# Patient Record
Sex: Male | Born: 1940 | Race: Black or African American | Hispanic: No | Marital: Married
Health system: Southern US, Community
[De-identification: ages and names within clinical notes are randomized; demographics above are authoritative.]

## PROBLEM LIST (undated history)

## (undated) DIAGNOSIS — E785 Hyperlipidemia, unspecified: Secondary | ICD-10-CM

## (undated) DIAGNOSIS — I480 Paroxysmal atrial fibrillation: Secondary | ICD-10-CM

## (undated) DIAGNOSIS — G1221 Amyotrophic lateral sclerosis: Secondary | ICD-10-CM

## (undated) DIAGNOSIS — D638 Anemia in other chronic diseases classified elsewhere: Secondary | ICD-10-CM

## (undated) DIAGNOSIS — I4892 Unspecified atrial flutter: Secondary | ICD-10-CM

## (undated) DIAGNOSIS — N183 Chronic kidney disease, stage 3 unspecified: Secondary | ICD-10-CM

## (undated) DIAGNOSIS — E875 Hyperkalemia: Principal | ICD-10-CM

## (undated) DIAGNOSIS — I2510 Atherosclerotic heart disease of native coronary artery without angina pectoris: Secondary | ICD-10-CM

## (undated) DIAGNOSIS — I10 Essential (primary) hypertension: Secondary | ICD-10-CM

## (undated) HISTORY — DX: Atherosclerotic heart disease of native coronary artery without angina pectoris: I25.10

---

## 2013-10-12 ENCOUNTER — Emergency Department: Payer: Self-pay | Admitting: Emergency Medicine

## 2013-10-13 LAB — BASIC METABOLIC PANEL
ANION GAP: 7 (ref 7–16)
BUN: 26 mg/dL — ABNORMAL HIGH (ref 7–18)
CALCIUM: 9.2 mg/dL (ref 8.5–10.1)
Chloride: 106 mmol/L (ref 98–107)
Co2: 25 mmol/L (ref 21–32)
Creatinine: 1.49 mg/dL — ABNORMAL HIGH (ref 0.60–1.30)
EGFR (African American): 54 — ABNORMAL LOW
EGFR (Non-African Amer.): 47 — ABNORMAL LOW
GLUCOSE: 107 mg/dL — AB (ref 65–99)
Osmolality: 281 (ref 275–301)
POTASSIUM: 4.3 mmol/L (ref 3.5–5.1)
SODIUM: 138 mmol/L (ref 136–145)

## 2013-10-13 LAB — CBC
HCT: 37.8 % — ABNORMAL LOW (ref 40.0–52.0)
HGB: 12.6 g/dL — ABNORMAL LOW (ref 13.0–18.0)
MCH: 31.5 pg (ref 26.0–34.0)
MCHC: 33.3 g/dL (ref 32.0–36.0)
MCV: 95 fL (ref 80–100)
PLATELETS: 243 10*3/uL (ref 150–440)
RBC: 4.01 10*6/uL — AB (ref 4.40–5.90)
RDW: 11.9 % (ref 11.5–14.5)
WBC: 6.5 10*3/uL (ref 3.8–10.6)

## 2013-10-13 LAB — TROPONIN I
Troponin-I: 0.02 ng/mL
Troponin-I: 0.03 ng/mL

## 2020-11-05 ENCOUNTER — Emergency Department
Admission: EM | Admit: 2020-11-05 | Discharge: 2020-11-05 | Disposition: A | Discharge status: Home / Self Care | Payer: Medicare Other | Attending: Emergency Medicine | Admitting: Emergency Medicine

## 2020-11-05 ENCOUNTER — Other Ambulatory Visit: Payer: Self-pay

## 2020-11-05 ENCOUNTER — Emergency Department: Payer: Medicare Other

## 2020-11-05 DIAGNOSIS — S0990XA Unspecified injury of head, initial encounter: Secondary | ICD-10-CM

## 2020-11-05 DIAGNOSIS — I129 Hypertensive chronic kidney disease with stage 1 through stage 4 chronic kidney disease, or unspecified chronic kidney disease: Secondary | ICD-10-CM | POA: Diagnosis not present

## 2020-11-05 DIAGNOSIS — R55 Syncope and collapse: Secondary | ICD-10-CM | POA: Insufficient documentation

## 2020-11-05 DIAGNOSIS — N189 Chronic kidney disease, unspecified: Secondary | ICD-10-CM | POA: Insufficient documentation

## 2020-11-05 DIAGNOSIS — W19XXXA Unspecified fall, initial encounter: Secondary | ICD-10-CM

## 2020-11-05 DIAGNOSIS — R42 Dizziness and giddiness: Secondary | ICD-10-CM | POA: Diagnosis not present

## 2020-11-05 LAB — BASIC METABOLIC PANEL
Anion gap: 11 (ref 5–15)
BUN: 36 mg/dL — ABNORMAL HIGH (ref 8–23)
CO2: 24 mmol/L (ref 22–32)
Calcium: 9.4 mg/dL (ref 8.9–10.3)
Chloride: 99 mmol/L (ref 98–111)
Creatinine, Ser: 1.67 mg/dL — ABNORMAL HIGH (ref 0.61–1.24)
GFR, Estimated: 42 mL/min — ABNORMAL LOW (ref >60)
Glucose, Bld: 94 mg/dL (ref 70–99)
Potassium: 4.8 mmol/L (ref 3.5–5.1)
Sodium: 134 mmol/L — ABNORMAL LOW (ref 135–145)

## 2020-11-05 LAB — CBC
HCT: 36.9 % — ABNORMAL LOW (ref 39.0–52.0)
Hemoglobin: 12 g/dL — ABNORMAL LOW (ref 13.0–17.0)
MCH: 30.2 pg (ref 26.0–34.0)
MCHC: 32.5 g/dL (ref 30.0–36.0)
MCV: 92.9 fL (ref 80.0–100.0)
Platelets: 376 10*3/uL (ref 150–400)
RBC: 3.97 MIL/uL — ABNORMAL LOW (ref 4.22–5.81)
RDW: 11.5 % (ref 11.5–15.5)
WBC: 7.1 10*3/uL (ref 4.0–10.5)
nRBC: 0 % (ref 0.0–0.2)

## 2020-11-05 LAB — TROPONIN I (HIGH SENSITIVITY): Troponin I (High Sensitivity): 14 ng/L (ref <18)

## 2020-11-05 NOTE — ED Notes (Signed)
Pt alert and oriented X 4, stable for discharge. RR even and unlabored, color WNL. Discussed discharge instructions and follow-up as directed. Discharge medications discussed if prescribed. Pt had opportunity to ask questions, and RN to provide patient/family eduction.  

## 2020-11-05 NOTE — Discharge Instructions (Addendum)
You have been seen in the emergency department after passing out.  As we discussed please drink plenty of fluids over the next several days.  Please sit when urinating overnight or if you are feeling lightheaded or dizzy.  Please follow-up with your doctor for further evaluation and to discuss further work-up such as a Holter monitor.  Return to the emergency department for any further lightheadedness episodes, passing out, any chest pain trouble breathing or any other symptom personally concerning to yourself.

## 2020-11-05 NOTE — ED Triage Notes (Signed)
C/O feeling dizzy after going to the bathroom this morning.  States fell and hit forehead.  ? LOC. States after initial fall, felt dizzy again and wife found patient on floor.  AAOx3.  Skin warm and dry. NAD

## 2020-11-05 NOTE — ED Provider Notes (Signed)
[facility] Emergency Department Provider Note  Time seen: 11:54 AM  I have reviewed the triage vital signs and the nursing notes.   HISTORY  Chief Complaint Fall   HPI Raymond Meyers. is a 80 y.o. male with a past medical history of hypertension, hyperlipidemia, CKD, palpitations on metoprolol who presents to the emergency department after syncopal episode.  According to the patient around 4:00 this morning patient just finished urinating when he went to flush the toilet became very lightheaded and dizzy.  States he went to brace himself against the sink and ended up losing consciousness and hitting his head.  Patient states he tried to stand back up and got dizzy and fell to the ground again.  Patient states he had a primary care appointment today at 2 PM so he decided to rest in bed, but the patient was concerned due to a cut on his forehead so he came to the emergency department after some time for evaluation.   Here the patient is awake alert oriented, no distress.  He states 2 to 3 weeks ago similar event happened overnight while he was urinating as well.  States no other events have occurred.  Denies any chest pain shortness of breath at any point.  Denies any nausea vomiting or diarrhea.  No recent illnesses or fever.  No past medical history on file.  There are no problems to display for this patient.   Prior to Admission medications   Not on File    No Known Allergies  No family history on file.  Social History    Review of Systems Constitutional: Positive for LOC. Cardiovascular: Negative for chest pain. Respiratory: Negative for shortness of breath.  Gastrointestinal: Negative for abdominal pain, vomiting and diarrhea. Genitourinary: Negative for urinary compaints Musculoskeletal: Negative for musculoskeletal complaints Skin: Laceration to forehead Neurological: Negative for headache All other ROS  negative  ____________________________________________   PHYSICAL EXAM:  VITAL SIGNS: ED Triage Vitals  Enc Vitals Group     BP 11/05/20 1027 135/69     Pulse Rate 11/05/20 1027 81     Resp 11/05/20 1027 18     Temp 11/05/20 1027 98.7 F (37.1 C)     Temp Source 11/05/20 1027 Oral     SpO2 11/05/20 1027 98 %     Weight 11/05/20 1023 152 lb 6.4 oz (69.1 kg)     Height 11/05/20 1023 6' (1.829 m)     Head Circumference --      Peak Flow --      Pain Score 11/05/20 1023 3     Pain Loc --      Pain Edu? --      Excl. in GC? --    Constitutional: Alert and oriented. Well appearing and in no distress. Eyes: Normal exam ENT      Head: Normocephalic and atraumatic.      Mouth/Throat: Mucous membranes are moist. Cardiovascular: Normal rate, regular rhythm.  Respiratory: Normal respiratory effort without tachypnea nor retractions. Breath sounds are clear  Gastrointestinal: Soft and nontender. No distention.   Musculoskeletal: Nontender with normal range of motion in all extremities. Neurologic:  Normal speech and language. No gross focal neurologic deficits  Skin: Patient has approximate 2 cm nongaping laceration to the forehead that is hemostatic. Psychiatric: Mood and affect are normal.  ____________________________________________    EKG  EKG viewed and interpreted by myself shows sinus rhythm 80 bpm with a narrow QRS, normal axis, normal intervals,  no concerning ST changes.  ____________________________________________    RADIOLOGY  CT scan of the head and C-spine are negative for acute abnormality.  ____________________________________________   INITIAL IMPRESSION / ASSESSMENT AND PLAN / ED COURSE  Pertinent labs & imaging results that were available during my care of the patient were reviewed by me and considered in my medical decision making (see chart for details).   Patient presents to the emergency department after syncopal event that occurred following  urination.  Symptoms are very suggestive of micturition syncope.  They occurred 2 weeks ago and again this morning.  Currently the patient appears well, vitals are reassuring, labs are reassuring, EKG and CT scans are reassuring.  I did add on a troponin as a precaution which is pending.  Patient has a small laceration to the forehead that is nongaping and hemostatic.  Repaired with Dermabond with good result.  If the patient's troponin is negative I do believe the patient would be safe for discharge home.  I discussed with the patient increasing his fluid intake, sitting while urinating overnight and following up with his doctor for further evaluation and likely Holter monitor.  Patient has worn a Holter monitor previously and was prescribed metoprolol per patient for palpitations.  Patient is still taking metoprolol.  Troponin is negative.  Patient appears well.  We will discharge patient home with increased fluids and have the patient follow-up with his doctor as well as cardiology.  Patient agreeable to plan of care.  Raymond Meyers. was evaluated in Emergency Department on 11/05/2020 for the symptoms described in the history of present illness. He was evaluated in the context of the global COVID-19 pandemic, which necessitated consideration that the patient might be at risk for infection with the SARS-CoV-2 virus that causes COVID-19. Institutional protocols and algorithms that pertain to the evaluation of patients at risk for COVID-19 are in a state of rapid change based on information released by regulatory bodies including the CDC and federal and state organizations. These policies and algorithms were followed during the patient's care in the ED.  ____________________________________________   FINAL CLINICAL IMPRESSION(S) / ED DIAGNOSES  Micturition syncope   Minna Antis, MD 11/05/20 1315

## 2022-09-22 ENCOUNTER — Other Ambulatory Visit: Payer: Self-pay

## 2022-09-22 ENCOUNTER — Emergency Department
Admission: EM | Admit: 2022-09-22 | Discharge: 2022-09-23 | Discharge status: Against Medical Advice | Payer: Medicare PPO | Attending: Family Medicine | Admitting: Family Medicine

## 2022-09-22 ENCOUNTER — Emergency Department: Payer: Medicare PPO

## 2022-09-22 DIAGNOSIS — Z5321 Procedure and treatment not carried out due to patient leaving prior to being seen by health care provider: Secondary | ICD-10-CM | POA: Diagnosis not present

## 2022-09-22 DIAGNOSIS — Y92002 Bathroom of unspecified non-institutional (private) residence single-family (private) house as the place of occurrence of the external cause: Secondary | ICD-10-CM | POA: Insufficient documentation

## 2022-09-22 DIAGNOSIS — R42 Dizziness and giddiness: Secondary | ICD-10-CM | POA: Insufficient documentation

## 2022-09-22 DIAGNOSIS — W228XXA Striking against or struck by other objects, initial encounter: Secondary | ICD-10-CM | POA: Insufficient documentation

## 2022-09-22 DIAGNOSIS — R55 Syncope and collapse: Secondary | ICD-10-CM | POA: Diagnosis not present

## 2022-09-22 LAB — BASIC METABOLIC PANEL
Anion gap: 11 (ref 5–15)
BUN: 47 mg/dL — ABNORMAL HIGH (ref 8–23)
CO2: 24 mmol/L (ref 22–32)
Calcium: 9.4 mg/dL (ref 8.9–10.3)
Chloride: 97 mmol/L — ABNORMAL LOW (ref 98–111)
Creatinine, Ser: 2.04 mg/dL — ABNORMAL HIGH (ref 0.61–1.24)
GFR, Estimated: 32 mL/min — ABNORMAL LOW (ref >60)
Glucose, Bld: 111 mg/dL — ABNORMAL HIGH (ref 70–99)
Potassium: 4.9 mmol/L (ref 3.5–5.1)
Sodium: 132 mmol/L — ABNORMAL LOW (ref 135–145)

## 2022-09-22 LAB — CBC
HCT: 34.7 % — ABNORMAL LOW (ref 39.0–52.0)
Hemoglobin: 11.1 g/dL — ABNORMAL LOW (ref 13.0–17.0)
MCH: 30.6 pg (ref 26.0–34.0)
MCHC: 32 g/dL (ref 30.0–36.0)
MCV: 95.6 fL (ref 80.0–100.0)
Platelets: 262 10*3/uL (ref 150–400)
RBC: 3.63 MIL/uL — ABNORMAL LOW (ref 4.22–5.81)
RDW: 11.7 % (ref 11.5–15.5)
WBC: 4.6 10*3/uL (ref 4.0–10.5)
nRBC: 0 % (ref 0.0–0.2)

## 2022-09-22 NOTE — ED Triage Notes (Signed)
Pt to ED for LOC this morning while in bathroom, states started feeling dizzy and grabbed onto sink, woke up on floor.  Reports hitting left fore head and lip.  +blood thinners.  Pt alert and oriented.

## 2022-12-20 IMAGING — CT CT HEAD W/O CM
3 series · 15 of 47 positions shown, 18 images · non-contrast
Comparison: None.

CLINICAL DATA: The patient suffered a fall and blow to the head due
to an episode of dizziness this morning. Initial encounter.

EXAM:
CT HEAD WITHOUT CONTRAST
CT CERVICAL SPINE WITHOUT CONTRAST
TECHNIQUE: Multidetector CT imaging of the head and cervical spine was
performed following the standard protocol without intravenous
contrast. Multiplanar CT image reconstructions of the cervical spine
were also generated.

[Series 2: head wo · axial · 0.45mm/px · z∈[-112,+13]mm · 9 of 30 slices shown, 12 images]
[im 3/30  brain]
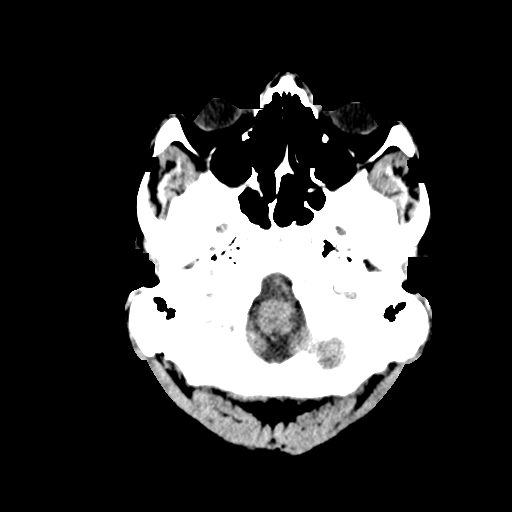
[im 3/30  bone]
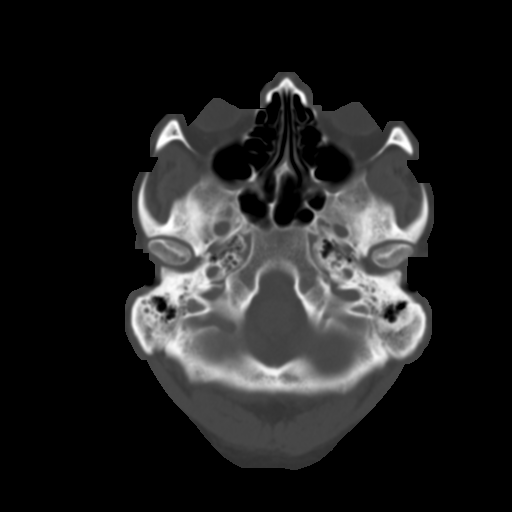
[im 6/30  brain]
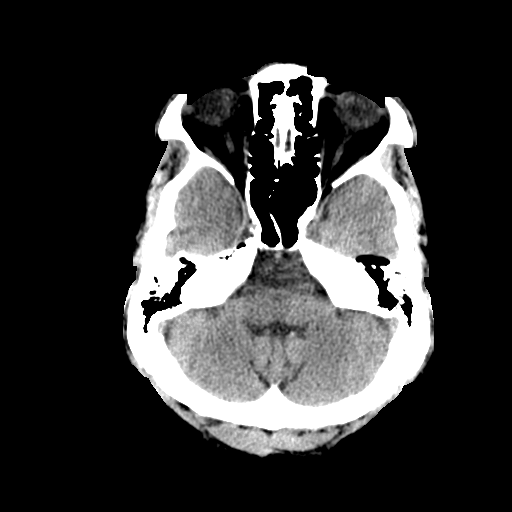
[im 9/30  brain]
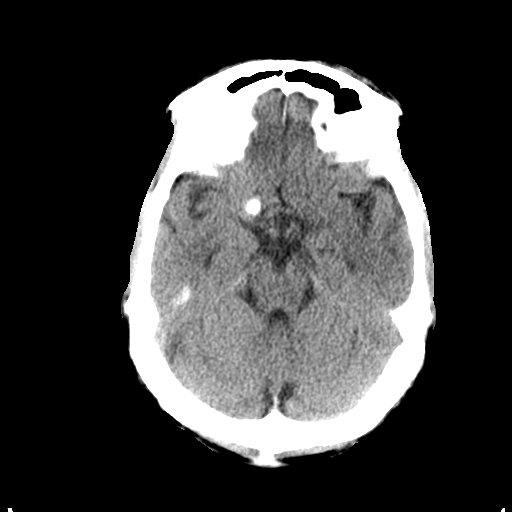
[im 12/30  brain]
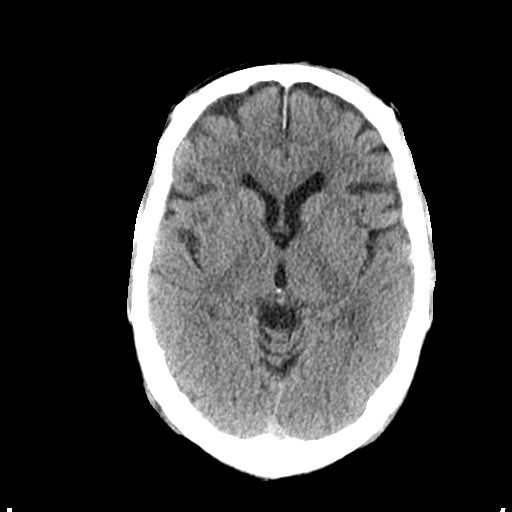
[im 16/30  brain]
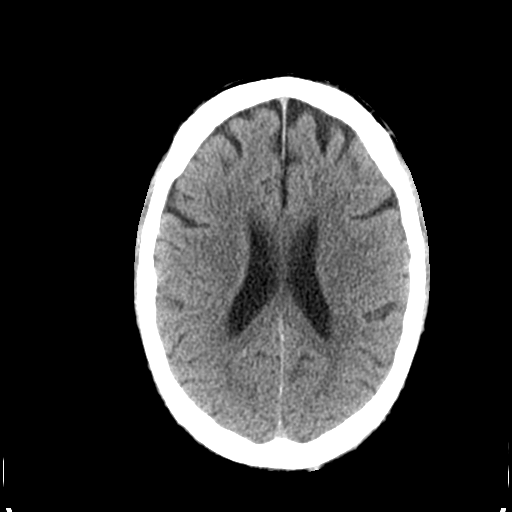
[im 16/30  bone]
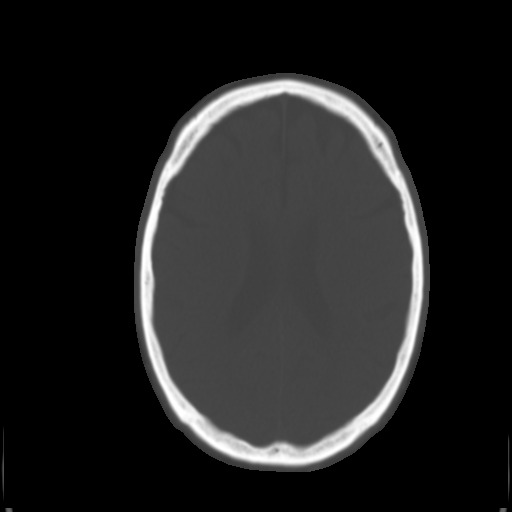
[im 19/30  brain]
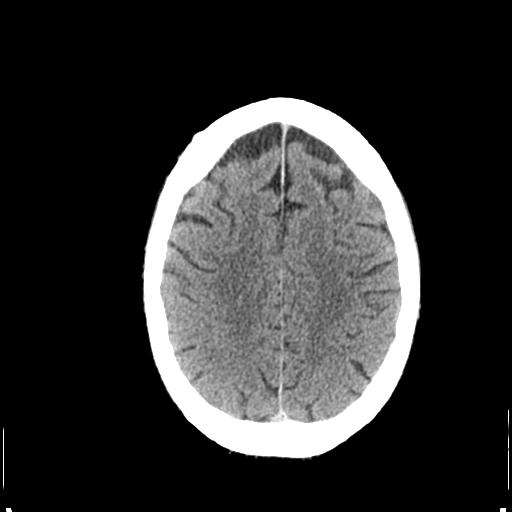
[im 22/30  brain]
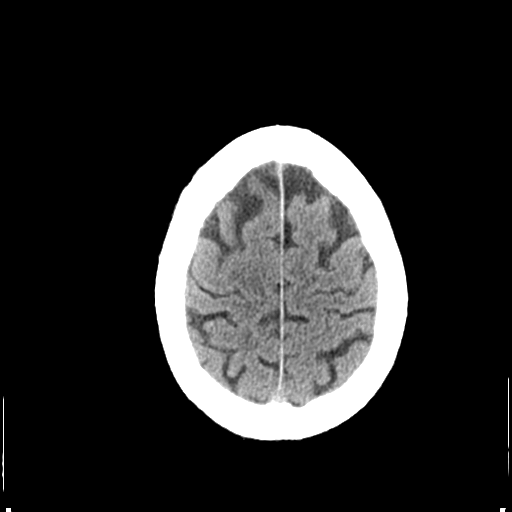
[im 25/30  brain]
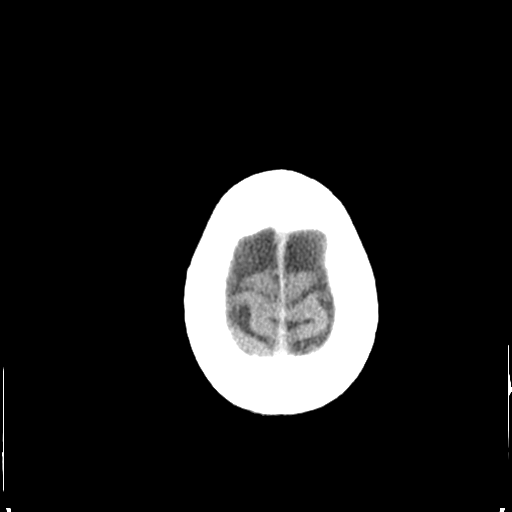
[im 28/30  brain]
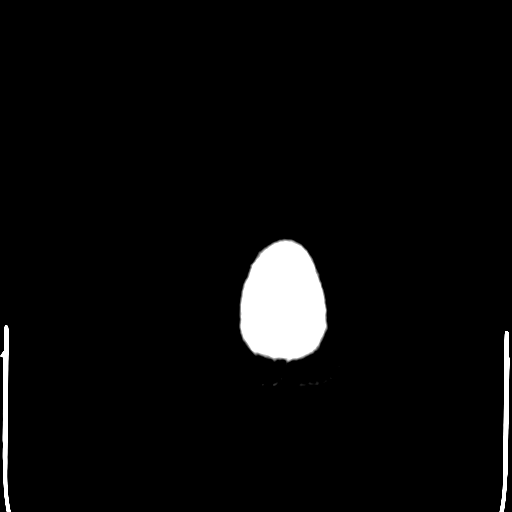
[im 28/30  bone]
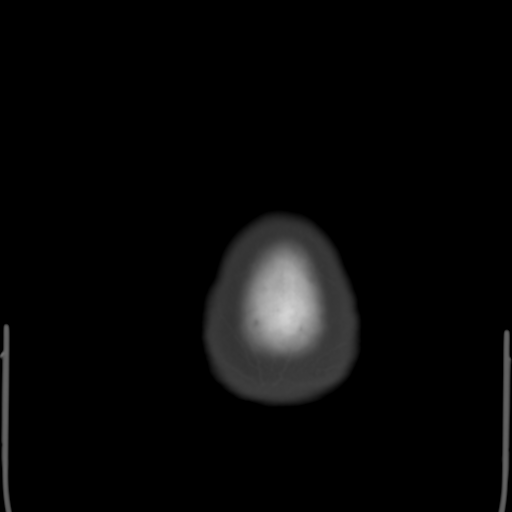

[Series 4: coronal soft tissue · coronal · 0.29mm/px · 3 of 66 slices shown]
[im 22/66  brain]
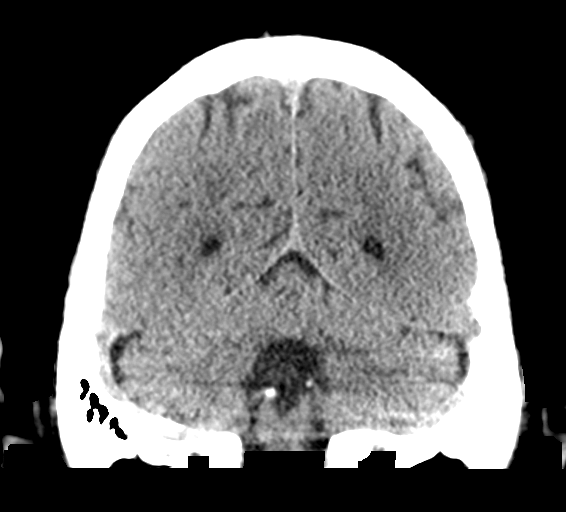
[im 29/66  brain]
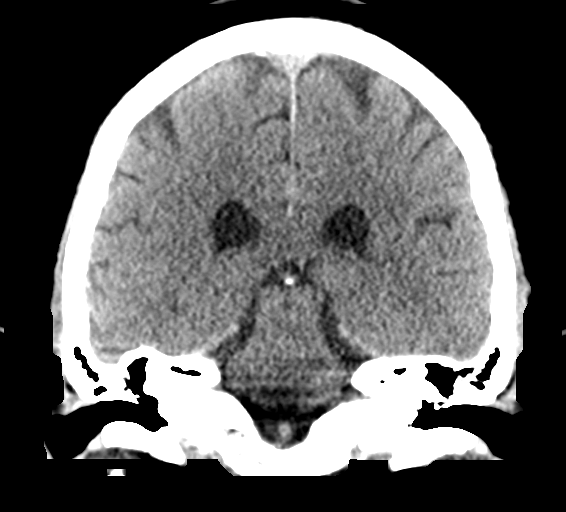
[im 37/66  brain]
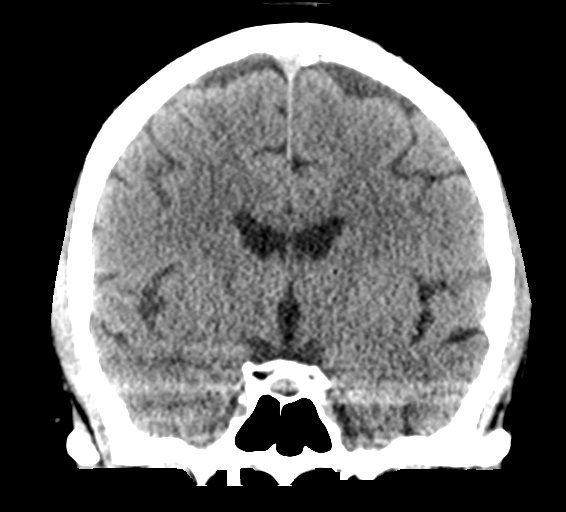

[Series 5: sagittal soft tissue · sagittal · 0.29mm/px · 3 of 55 slices shown]
[im 19/55  brain]
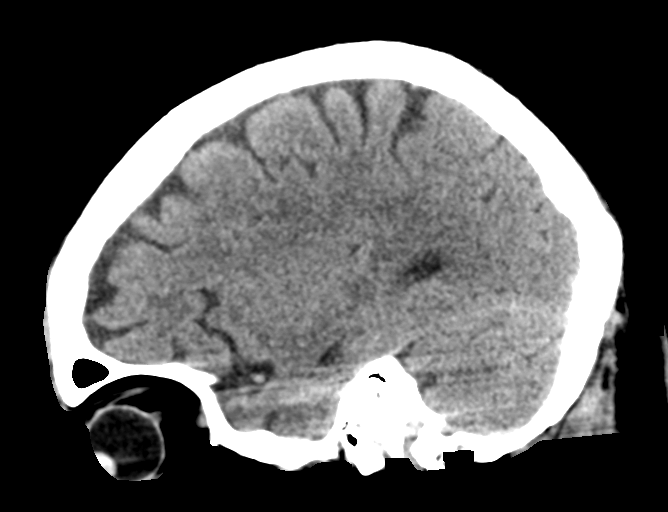
[im 28/55  brain]
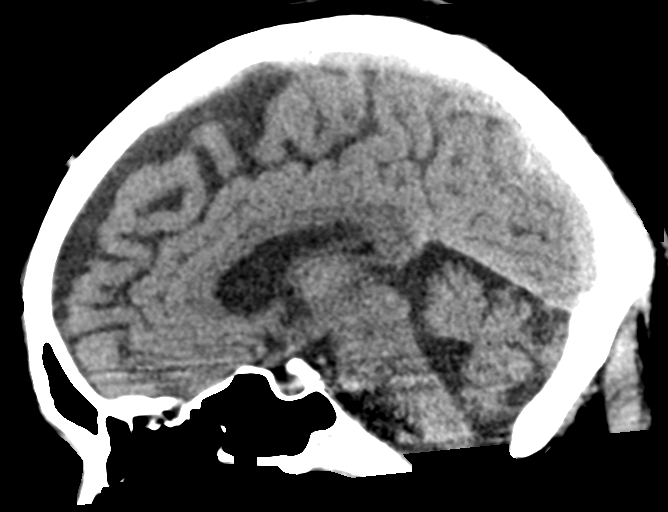
[im 37/55  brain]
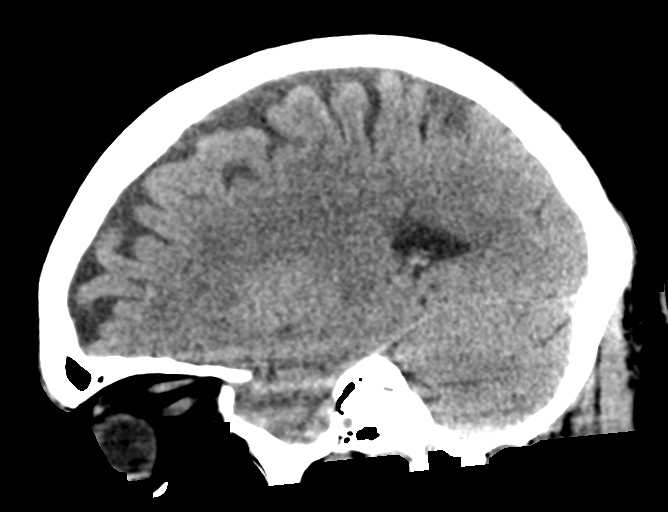

[15 of 47 positions shown; findings below may reference images not displayed]

FINDINGS: CT HEAD FINDINGS

Brain: No evidence of acute infarction, hemorrhage, hydrocephalus,
extra-axial collection or mass lesion/mass effect.

Vascular: No hyperdense vessel or unexpected calcification.

Skull: Intact.  No focal lesion.

Sinuses/Orbits: Negative.

Other: None.

CT CERVICAL SPINE FINDINGS

Alignment: No listhesis.  Mild reversal of lordosis noted.

Skull base and vertebrae: No acute fracture. No primary bone lesion
or focal pathologic process.

Soft tissues and spinal canal: No prevertebral fluid or swelling. No
visible canal hematoma.

Disc levels: Multilevel loss of disc space height is worst at C4-5
and C5-6.

Upper chest: No acute finding. Punctate calcified granulomata in the
right upper lobe noted.

Other: None.
IMPRESSION: Negative head CT.  No acute abnormality cervical spine.

Cervical spondylosis.

## 2024-07-04 ENCOUNTER — Emergency Department

## 2024-07-04 ENCOUNTER — Other Ambulatory Visit: Payer: Self-pay

## 2024-07-04 ENCOUNTER — Emergency Department
Admission: EM | Admit: 2024-07-04 | Discharge: 2024-07-04 | Disposition: A | Discharge status: Home / Self Care | Attending: Emergency Medicine | Admitting: Emergency Medicine

## 2024-07-04 DIAGNOSIS — I480 Paroxysmal atrial fibrillation: Secondary | ICD-10-CM | POA: Diagnosis not present

## 2024-07-04 DIAGNOSIS — S0001XA Abrasion of scalp, initial encounter: Secondary | ICD-10-CM | POA: Diagnosis present

## 2024-07-04 DIAGNOSIS — Z7901 Long term (current) use of anticoagulants: Secondary | ICD-10-CM | POA: Insufficient documentation

## 2024-07-04 DIAGNOSIS — G1221 Amyotrophic lateral sclerosis: Secondary | ICD-10-CM | POA: Diagnosis not present

## 2024-07-04 DIAGNOSIS — W19XXXA Unspecified fall, initial encounter: Secondary | ICD-10-CM

## 2024-07-04 DIAGNOSIS — I129 Hypertensive chronic kidney disease with stage 1 through stage 4 chronic kidney disease, or unspecified chronic kidney disease: Secondary | ICD-10-CM | POA: Diagnosis not present

## 2024-07-04 DIAGNOSIS — W01198A Fall on same level from slipping, tripping and stumbling with subsequent striking against other object, initial encounter: Secondary | ICD-10-CM | POA: Insufficient documentation

## 2024-07-04 DIAGNOSIS — N183 Chronic kidney disease, stage 3 unspecified: Secondary | ICD-10-CM | POA: Diagnosis not present

## 2024-07-04 DIAGNOSIS — S0990XA Unspecified injury of head, initial encounter: Secondary | ICD-10-CM | POA: Diagnosis present

## 2024-07-04 HISTORY — DX: Amyotrophic lateral sclerosis: G12.21

## 2024-07-04 MED ORDER — BACITRACIN ZINC 500 UNIT/GM EX OINT
TOPICAL_OINTMENT | Freq: Once | CUTANEOUS | Status: AC
  Administered 2024-07-04: 1 via TOPICAL
  Filled 2024-07-04: qty 0.9

## 2024-07-04 NOTE — ED Notes (Signed)
 Wound cleaned with chlorhexidine soap and saline. Provider inspected wound and determined that it did not require sutures or staples. Abrasion given light coat of bacitracin and dry telfa dressing. Pt educated on wound management and verbalized understanding.

## 2024-07-04 NOTE — ED Provider Notes (Signed)
 [facility] Provider Note    Event Date/Time   First MD Initiated Contact with Patient 07/04/24 1930     (approximate)   History   Fall   HPI  Raymond Meyers. is a 83 y.o. male who presents to the ED for evaluation of Fall   I review a routine geriatrics clinic visit from 1 month ago.  History of CKD 3, paroxysmal A-fib, HTN,  ALS.  Anticoagulated on Eliquis  Patient presents to the ED with a scalp injury and fall.  Reports he was loading groceries into a cart and tried to get it up over a curb when it was heavier than expected, causing him to fall backwards and strike his head.  No syncope or other trauma/injuries.   Physical Exam   Triage Vital Signs: ED Triage Vitals  Encounter Vitals Group     BP 07/04/24 1727 (!) 155/63     Girls Systolic BP Percentile --      Girls Diastolic BP Percentile --      Boys Systolic BP Percentile --      Boys Diastolic BP Percentile --      Pulse Rate 07/04/24 1727 72     Resp 07/04/24 1727 19     Temp 07/04/24 1727 98 F (36.7 C)     Temp src --      SpO2 07/04/24 1727 99 %     Weight 07/04/24 1724 134 lb 0.6 oz (60.8 kg)     Height 07/04/24 1724 6' (1.829 m)     Head Circumference --      Peak Flow --      Pain Score 07/04/24 1724 10     Pain Loc --      Pain Education --      Exclude from Growth Chart --     Most recent vital signs: Vitals:   07/04/24 1727  BP: (!) 155/63  Pulse: 72  Resp: 19  Temp: 98 F (36.7 C)  SpO2: 99%    General: Awake, no distress.  CV:  Good peripheral perfusion.  Resp:  Normal effort.  Abd:  No distention.  MSK:  No deformity noted.  Palpation of all 4 extremities otherwise without evidence of deformity, tenderness or trauma. Neuro:  No focal deficits appreciated. Other:  Small amount of dried blood to occipital injury.  After cleaning he has abrasions that are fairly superficial, some areas down into the dermis but no significant laceration.  No ongoing  bleeding or gaping wound that would benefit from staple or suture repair.   ED Results / Procedures / Treatments   Labs (all labs ordered are listed, but only abnormal results are displayed) Labs Reviewed - No data to display  EKG   RADIOLOGY CT head interpreted by me without evidence of acute intracranial pathology CT cervical spine interpreted by me without evidence of fracture or dislocation  Official radiology report(s): CT Head Wo Contrast Result Date: 07/04/2024 EXAM: CT HEAD AND CERVICAL SPINE 07/04/2024 06:45:00 PM TECHNIQUE: CT of the head and cervical spine was performed without the administration of intravenous contrast. Multiplanar reformatted images are provided for review. Automated exposure control, iterative reconstruction, and/or weight based adjustment of the mA/kV was utilized to reduce the radiation dose to as low as reasonably achievable. COMPARISON: CT head and c-spine 11/05/2020, 03/03/2023. CLINICAL HISTORY: Fall, head injury on blood thinners. First Nurse Note: Patient to ED via [facility] from home for mechanical fall. PT was on the  ground for approx 1 hr. Denies LOC, hit head- lac on head. On blood thinners. C/o stiffness in neck. FINDINGS: CT HEAD BRAIN AND VENTRICLES: No acute intracranial hemorrhage. No mass effect or midline shift. No abnormal extra-axial fluid collection. No evidence of acute infarct. No hydrocephalus. ORBITS: Bilateral lens replacements. No acute abnormality. SINUSES AND MASTOIDS: No acute abnormality. SOFT TISSUES AND SKULL: Trace right occipital scalp hematoma (4.163). No acute skull fracture. CT CERVICAL SPINE BONES AND ALIGNMENT: No acute fracture or traumatic malalignment. DEGENERATIVE CHANGES: Multilevel moderate severity degenerative changes of the spine. 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 No severe osseous central canal stenosis. SOFT TISSUES: No prevertebral soft tissue swelling. Redemonstration of chronic  centrally calcified nodules at the right lung apex- No further follow up indicated. IMPRESSION: 1. No acute intracranial abnormality. 2. No acute fracture or traumatic malalignment of the cervical spine. Electronically signed by: Morgane Naveau MD 07/04/2024 07:04 PM EDT RP Workstation: [id number]   CT CERVICAL SPINE WO CONTRAST Result Date: 07/04/2024 EXAM: CT HEAD AND CERVICAL SPINE 07/04/2024 06:45:00 PM TECHNIQUE: CT of the head and cervical spine was performed without the administration of intravenous contrast. Multiplanar reformatted images are provided for review. Automated exposure control, iterative reconstruction, and/or weight based adjustment of the mA/kV was utilized to reduce the radiation dose to as low as reasonably achievable. COMPARISON: CT head and c-spine 11/05/2020, 03/03/2023. CLINICAL HISTORY: Fall, head injury on blood thinners. First Nurse Note: Patient to ED via [facility] from home for mechanical fall. PT was on the ground for approx 1 hr. Denies LOC, hit head- lac on head. On blood thinners. C/o stiffness in neck. FINDINGS: CT HEAD BRAIN AND VENTRICLES: No acute intracranial hemorrhage. No mass effect or midline shift. No abnormal extra-axial fluid collection. No evidence of acute infarct. No hydrocephalus. ORBITS: Bilateral lens replacements. No acute abnormality. SINUSES AND MASTOIDS: No acute abnormality. SOFT TISSUES AND SKULL: Trace right occipital scalp hematoma (4.163). No acute skull fracture. CT CERVICAL SPINE BONES AND ALIGNMENT: No acute fracture or traumatic malalignment. DEGENERATIVE CHANGES: Multilevel moderate severity degenerative changes of the spine. 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 No severe osseous central canal stenosis. SOFT TISSUES: No prevertebral soft tissue swelling. Redemonstration of chronic centrally calcified nodules at the right lung apex- No further follow up indicated. IMPRESSION: 1. No acute intracranial abnormality. 2. No  acute fracture or traumatic malalignment of the cervical spine. Electronically signed by: Morgane Naveau MD 07/04/2024 07:04 PM EDT RP Workstation: [id number]    PROCEDURES and INTERVENTIONS:  Procedures  Medications  bacitracin ointment (has no administration in time range)     IMPRESSION / MDM / ASSESSMENT AND PLAN / ED COURSE  I reviewed the triage vital signs and the nursing notes.  Differential diagnosis includes, but is not limited to, laceration, abrasion, ICH, skull fracture, syncope or seizure  {Patient presents with symptoms of an acute illness or injury that is potentially life-threatening.  Patient with ALS presents after mechanical fall with head trauma.  No evidence of additional trauma, neurologic deficits.  He looks well.  Reassuring imaging without ICH, skull fracture.  After cleaning his wound he does not have any discrete laceration of benefit from repair.  Provide local wound care, bacitracin, discussed wound care at home and ED return precautions.  Patient suitable for outpatient management  Clinical Course as of 07/04/24 2131  Mon Jul 04, 2024  2116 Reassessed after cleaning his wound.  No significant laceration that would benefit from staple or suture  repair.  Abrasions as deep as the dermis but nothing gaping, bleeding or that would benefit from closure. [DS]    Clinical Course User Index [DS] Claudene Rover, MD     FINAL CLINICAL IMPRESSION(S) / ED DIAGNOSES   Final diagnoses:  Fall, initial encounter  Abrasion of scalp, initial encounter     Rx / DC Orders   ED Discharge Orders     None        Note:  This document was prepared using Dragon voice recognition software and may include unintentional dictation errors.   Claudene Rover, MD 07/04/24 [phone]

## 2024-07-04 NOTE — ED Triage Notes (Signed)
 First Nurse Note: Patient to ED via [facility] from home for mechanical fall. PT was on the ground for approx 1 hr. Denies LOC, hit head- lac on head. On blood thinners. C/o stiffness in neck.   70 HR 99% RA 180/80- hx HTN

## 2024-07-04 NOTE — Discharge Instructions (Signed)
Gently wash the wound with soap and water.  It is okay to shower, but do not submerge in a bath or go swimming as it is healing.  Do not vigorously scrub.   Gently pat dry.   Once dry, then apply Neosporin or bacitracin or even Vaseline ointment to the area to act as a barrier to help prevent infection.

## 2024-07-27 NOTE — Discharge Summary (Addendum)
 "  [facility] Medicine Discharge Summary  Admit Date: 07/25/2024 Discharge Date: 07/27/2024  3:23 PM  Admitting Physician: Dorothe Humphrey Mau, MD Discharge Physician: Marinda Asberry Pedro, MD  Primary Care Provider: Jesus Mardy Rana, MD, Phone [phone]  Discharge Destination: Home with home health: PT/OT, received DME wheelchair at [facility] (through printed script)   Admission Diagnoses:  Chest pain, unspecified type [R07.9]  Discharge Diagnoses:  Principal Problem:   Hyperkalemia Active Problems:   ALS (amyotrophic lateral sclerosis) (CMS/HHS-HCC)   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 *  Primary Diagnosis: Sent to ED by PCP for hyperkalemia on routine outpatient labs. Patient thought he was going to [facility] ED but accidentally came to [facility] ED and was admitted for further management.   Changes Made (with rationale):  Stopped Lisinopril and Lasix Started Flomax  0.4mg  QHS  Started Sennakot BID for bowel regimen   To-Do List (incidental findings, follow-up studies, etc.): PCP follow up on Aug 11, 2025 to repeat BMP to re-evaluate K given changes PCP to follow up Neurology clinic referral for ALS - if unable to get into [facility] ALS clinic, consider referral to [facility] for ALS clinic given ongoing weakness, debility, dysphagia. Has not yet started Riluzole  - discuss again at follow up.  PCP to order VFSS either through ALS clinic or closer to home for eval for esophageal dysmotility   Anticipatory Guidance for Outpatient Care:  Has not been taking mirtazepine at home - PCP to follow up whether to re-start this for appetite and sleep      Results Pending at Discharge: None Please see phone numbers at end of this summary for lab contact information.   Follow-up/Care Transition Plan: Sched. appts: [facility] for [facility]  follow up on Mon, 08-11-25 at 10am for repeat labs and follow up for Neurology referral    Follow-up info: [facility], [place] [address] [place]   [address] [phone]    Jesus Mardy Rana, MD [address] [place] [address] [phone]  Follow up on 08/11/24 Follow up on 08-11-2025 with Dr. Georgina at 10am for repeat BMP      Allergies/Intolerances:  No Known Allergies   New Adverse Drug Events: none  Medications:     Current Discharge Medication List     START taking these medications      Instructions  polyethylene glycol packet Quantity: 30 packet Refills: 0  Commonly known as: MIRALAX Take 1 packet (17 g total) by mouth once daily as needed for Constipation for up to 30 days Mix in 4-8ounces of fluid prior to taking. Last time this was given: 17 g on July 27, 2024  9:09 AM   sennosides 8.6 mg tablet Quantity: 120 tablet Refills: 0  Commonly known as: SENOKOT Take 2 tablets by mouth once daily as needed for Constipation Last time this was given: 2 tablets on July 27, 2024  9:09 AM   tamsulosin  0.4 mg capsule Quantity: 30 capsule Refills: 0  Commonly known as: FLOMAX  Take 1 capsule (0.4 mg total) by mouth daily after dinner Take 30 minutes after same meal each day. Last time this was given: 0.4 mg on July 26, 2024  5:37 PM       CONTINUE taking these medications      Instructions  apixaban  2.5 mg tablet Refills: 0  Commonly known  as: ELIQUIS  Take 2.5 mg by mouth every 12 (twelve) hours Last time this was given: 2.5 mg on July 27, 2024  9:09 AM   cholecalciferol  2,000 unit tablet Refills: 0  Commonly known as: VITAMIN D3 Take by mouth   metoprolol  SUCCinate 50 MG XL tablet Refills: 0  Commonly known as: TOPROL -XL Take 75 mg by mouth once daily Last time this was given: 50 mg on July 27, 2024  9:35 AM   triamcinolone 0.1 % cream Quantity: 45 g Refills: 0  Apply topically 2 (two)  times daily   VELTASSA  8.4 gram packet Refills: 0 Generic drug: patiromer  calcium  sorbitex  Take 8.4 g by mouth once daily       STOP taking these medications    atorvastatin 40 MG tablet Commonly known as: LIPITOR   hydroCHLOROthiazide 12.5 MG tablet Commonly known as: HYDRODIURIL   lisinopriL 2.5 MG tablet Commonly known as: ZESTRIL   mirtazapine 15 MG tablet Commonly known as: REMERON         Brief History of Present Illness:  Raymond Meyers is a 83 y.o. male with PMH ALS (newly diagnosed in 05/2024), Afib s/p ablation on Apixaban , CKD stage 4 presented from his PCP clinic for hyperkalemia with concerning ECG changes.   Patient states that he [facility] [place] His labs were concerning for a K of 5.8 and Cr 1.9, so he was advised to return to clinic for an ECG. He presented today to his PCP clinic for an ECG which reportedly had concerning features, and was advised to go to the ED for labs. Patient states that he has otherwise been feeling okay except for palpitations which he has been having over the past few weeks. Patient's last episode of palpitation was this past Saturday.  Additionally, patient has been having shortness of breath when he walks however, this eventually resolves. He has no difficulty sleeping flat and has no dyspnea at rest. Patient denies abdominal pain, denies nausea, denies vomiting, denies diarrhea.  However, patient stated been constipated over the past 1 to 2 weeks.   He endorses progressive dysphagia, describing that pills often feel stuck in his throat, requiring him to drink water to swallow them. He also reports blurry vision, but no diplopia; he has a history of bilateral cataract surgeries. He denies new or worsening muscle cramps, though he occasionally experiences cramps when cold, which improve with rewarming.  He has a known history of ALS, diagnosed following progressive weight loss (165 lb to  116 lb over two years), muscle wasting, and worsening weakness. He has been using a cane since September and reports being unsteady on his feet with increased falls, the most recent three weeks ago, when he sustained a traumatic head injury requiring admission to [facility]. CT brain reportedly negative for bleeding.   He has been taking patiromer  daily (for >1 year). Lisinopril was stopped last week. He recently began Nepro and Boost Plus nutritional supplements about a month ago but reports decreased and inconsistent oral intake in recent weeks. Otherwise, no new dietary changes.  The patient lives alone in an independent living facility ([address], [place]) and has been there for one year. His wife lives in an adjacent apartment.   On presentation, vitals were T 36 C (96.8 F), HR 84, BP 115/84, RR 16, and satting 98 % on RA. Labs notable for Na 133*, K 4.3, HCO3 28, BUN 36*, sCr 1.5*. CBC notable  for WBC 6.5, Hgb 9.9*, plts 384. Initial VBG with pH 7.30, pCO2 63, and lactate 1.7, K 6.9. CXR unremarkable. Initial ECG shows NSR, narrow QRS, with features of LVH and possible peaked T waves (which were persistent even after correcting the potassium)  He received 2g IV Ca gluconate, 40mg  IV lasix, 1x duoneb, 1L LR, and 6U regular insulin + D50. His K on recheck was 4.3. However, repeat VBG showed pH 7.27, PCO2 72, and lactate 3.0, which corrected to 2.1 after 1L fluids. _____________________   Hospital Course by Problem:  # Hyperkalemia # CKD3b  Presented from PCP clinic after outpatient labs showed K 5.8, Cr 1.9, and ECG concerning for peaked T waves. He thought he was coming to [facility] ED but was surprised to find out he was at [facility] (after admission), as he is 100% service connected at [facility] and transitioning all of his care now from [facility] --> [facility]. On ED arrival, repeat labs showed K 6.9 (VBG) with metabolic acidosis (pH 7.30, pCO2 63). He was given IV calcium  and treated with IV  Lasix, Lokelma, insulin and K improved down to 4.6. He has a history of hyperkalemia in setting of his CKD and is on patiromer  at home (prescribed from his outpatient Nephrologist at [facility]). He has also been taking Lisinopril though had stopped ~ 1 week ago per PCP. Nutrition was consulted and he was counseled on use of low potassium foods and Nepro supplementation. His potassium was monitored and remained <5 on his BMP with holding ACEi and resumption of his home medications.  - PCP follow up at [facility] on 11/17 to repeat BMP - Continued Patiromer  at discharge - can increase dose at PCP follow up if K is increasing   # ALS (dx 05/2024)  # Progressive Dysphagia # Muscle Weakness # Chronic Respiratory Insufficiency # Recurrent falls Diagnosed 05/2024 after progressive weight loss, muscle wasting, and lower extremity weakness. Uses a cane since September with frequent falls, last 3 weeks ago (no head bleed on CT). Reports progressive dysphagia, especially with pills, decreased PO intake. Has been prescribed Riluzole  but has never started it due to concern about side effects. PT/OT were consulted and recommended [facility] PT/OT, which were set up. He was prescribed a wheelchair, and printed script given for him to pick up DME at [facility] (he declined financial agreement with insurance for DME through [facility]). He was seen by Speech who recommended regular diet with thins without any evidence of aspiration, though ?some esophageal dysmotility. He was interested in outpatient VFSS closer to home.  - PCP to follow up Neurology referral for ALS clinic at [facility]. Would encourage that he  - [place]uss Riluzole  initiation given concern for disease progression - Speech/swallow eval recommending outpatient VFSS close to home   # Constipation   Reports severe constipation x1-2 weeks with passage of only pellet-size stools with significant straining. Likely from poor PO intake, ALS-related dysmotility, and potassium binders. He was started on  Sennakot-docusate BID and Miralax BID. Ultimatley required Milk of Mag x 1 with good effect. - Continue Sennakot-docusate BID + Miralax PRN constipation at home. Wife encouraged him to start taking Metamucil daily.  - Encouraged PO hydration   # Atrial Fibrillation s/p Ablation (2000s) Rate controlled throughout. - Continue apixaban  2.5mg  BID - Continue metoprolol  75mg  daily  Malnutrition: He has been diagnosed with severe protein-calorie malnutrition in the context of chronic illness, based on severe muscle mass loss, severe subcutaneous fat loss.  - Recommend oral nutrition, Recommend  oral nutrition supplements.   Social Drivers of Health with Concerns   Housing Stability: Unknown (07/25/2024)   Housing Stability Vital Sign    Homeless in the Last Year: No    Surgeries and Procedures Performed: None  _____________________  Discharge Exam:  BP (!) 140/86 (BP Location: Left upper arm, Patient Position: Lying)   Pulse 82   Temp 36.4 C (97.5 F) (Oral)   Resp 18   Ht 176 cm (5' 9.29)   Wt 58.6 kg (129 lb 3 oz)   SpO2 100%   BMI 18.92 kg/m    GEN: Thin, frail with some temple wasting but generally well appearing  HEENT: PERRL, EOMI, MMM, no thrush  CHEST: Breathing comfortably on RA, no crackles or wheezes CV: RRR, no murmurs appreciated ABD: soft, non-distended, non-tender  EXT: w/w/p, no LE edema  SKIN: No rashes or lesions  NEURO: A+O x 3. Able to move all extremities and walks with assistance. Visible muscle fasciculations in neck, upper extremites noted.   Pertinent Lab Testing: Recent Labs  Lab 07/25/24 1814 07/25/24 1833 07/26/24 0440 07/27/24 0700  NA 133*  131*  131*   < > 133* 135  K 4.3  4.6  --  4.3 4.7  CL 93*  93*  --  92* 94*  CO2 26  28  --  28 32*  BUN 37*  40*  --  36* 34*  CREATININE 1.6*  1.7*  --  1.5* 1.5*  GLUCOSE 163*  164*  --  99 100  CALCIUM  10.2  10.1  --  9.6 9.4   < > = values in this interval not displayed.   Recent  Labs  Lab 07/25/24 1513 07/26/24 0440  AST  --  37  ALT 30 27  ALKPHOS 36 33  TBILI  --  1.2    Recent Labs  Lab 07/25/24 1513 07/26/24 0440 07/27/24 0700  WBC 7.6 6.5 6.1  HGB 10.6* 9.9* 10.1*  HCT 32.7* 30.2* 30.6*  PLT 359 384 380   Recent Labs  Lab 07/25/24 1513  APTT 27.2  INR 1.1     Other Pertinent Labs: None  Micro:  None    Pertinent Imaging:   X-ray chest PA and lateral Result Date: 07/25/2024 XR CHEST PA AND LATERAL INDICATION: Lung Aeration, R07.9 Chest pain, unspecified COMPARISON: None FINDINGS/IMPRESSION: 1.  The cardiomediastinal contours are within normal limits. 2.  No focal pulmonary opacities. 3.  No pneumothorax or pleural effusion. Electronically Signed by:  Comer Shoe, MD, [facility] Electronically Signed on:  07/25/2024 3:53 PM   _____________________  Code Status: DNAR Goals of care were not addressed during this admission.   Status on Discharge:  Current activity: Walks occasionally (07/26/24 2115) Current mobility: Slightly limited (07/26/24 2115)  Activity Recommendation: activity as tolerated  Other Discharge Instructions: Services setup at discharge: Home Health PT/OT Tubes/lines at discharge: None  Diet: Oral Supplements - Adult All Supplements; Novasource Renal-Vanilla; With Meals Diet regular Oral Supplements - Adult All Supplements; Boost Very High Calorie-Vanilla; With Meals  Wound Care Order Instructions     None       _____________________  Time spent on discharge process: 60 minutes    REBECCA THEODIS LUST, MD [facility]  07/27/2024   Hospital Contact Information:  [facility]) [facility]) [facility]) [facility])  Pending tests:  Laboratory: [phone] Microbiology: [phone] Pathology: [phone] Radiology: [phone]  General questions: [phone] Pending  tests: Laboratory: [phone] Microbiology: [phone] Pathology: [phone] Radiology: [phone]  General questions:  [phone] Pending tests:  Laboratory: [phone] Microbiology: [phone] Pathology: [phone] Radiology: [phone]  General questions:  [phone] Pending tests: Laboratory: [phone] Microbiology: [phone] Pathology: [phone] Radiology: [phone]  General questions:  [phone]    "

## 2024-08-25 NOTE — Telephone Encounter (Signed)
 All but 1 ALS ID appointments were canceled by patient. Need to confirm if patient is still coming to clinic for 12/12 appointment. Please send secure chat to Lorie with answer if patient calls back.

## 2024-08-30 NOTE — Telephone Encounter (Signed)
-----   Message from Ratechia H sent at 08/30/2024  8:36 AM EST ----- Regarding: RE: ALS ID clinic 09/23/24 Good Morning, All appts are scheduled. ----- Message ----- From: Marcine Cree, FNP Sent: 08/30/2024   8:26 AM EST To: [facility] Scheduling And Referrals [facility]# Subject: ALS ID clinic 09/23/24                           Good morning,  Please schedule this patient in ALS ID clinic with all disciplines on 09/23/24.  11 AM with Dr. Aram and all disciplines.  Thanks,  [employer]

## 2024-10-07 ENCOUNTER — Encounter: Payer: Self-pay | Admitting: Internal Medicine

## 2024-10-07 ENCOUNTER — Emergency Department

## 2024-10-07 ENCOUNTER — Observation Stay
Admission: EM | Admit: 2024-10-07 | Discharge: 2024-10-08 | Disposition: A | Attending: Emergency Medicine | Admitting: Emergency Medicine

## 2024-10-07 ENCOUNTER — Other Ambulatory Visit: Payer: Self-pay

## 2024-10-07 DIAGNOSIS — E875 Hyperkalemia: Principal | ICD-10-CM | POA: Diagnosis present

## 2024-10-07 DIAGNOSIS — I480 Paroxysmal atrial fibrillation: Secondary | ICD-10-CM | POA: Diagnosis present

## 2024-10-07 DIAGNOSIS — Z79899 Other long term (current) drug therapy: Secondary | ICD-10-CM | POA: Diagnosis not present

## 2024-10-07 DIAGNOSIS — N1831 Chronic kidney disease, stage 3a: Secondary | ICD-10-CM | POA: Insufficient documentation

## 2024-10-07 DIAGNOSIS — I2510 Atherosclerotic heart disease of native coronary artery without angina pectoris: Secondary | ICD-10-CM | POA: Diagnosis not present

## 2024-10-07 DIAGNOSIS — R0609 Other forms of dyspnea: Secondary | ICD-10-CM

## 2024-10-07 DIAGNOSIS — R7989 Other specified abnormal findings of blood chemistry: Secondary | ICD-10-CM | POA: Diagnosis present

## 2024-10-07 DIAGNOSIS — E871 Hypo-osmolality and hyponatremia: Secondary | ICD-10-CM | POA: Insufficient documentation

## 2024-10-07 DIAGNOSIS — I129 Hypertensive chronic kidney disease with stage 1 through stage 4 chronic kidney disease, or unspecified chronic kidney disease: Secondary | ICD-10-CM | POA: Diagnosis not present

## 2024-10-07 DIAGNOSIS — G1221 Amyotrophic lateral sclerosis: Secondary | ICD-10-CM | POA: Diagnosis present

## 2024-10-07 DIAGNOSIS — N400 Enlarged prostate without lower urinary tract symptoms: Secondary | ICD-10-CM | POA: Diagnosis present

## 2024-10-07 HISTORY — DX: Anemia in other chronic diseases classified elsewhere: D63.8

## 2024-10-07 HISTORY — DX: Hyperkalemia: E87.5

## 2024-10-07 HISTORY — DX: Essential (primary) hypertension: I10

## 2024-10-07 HISTORY — DX: Hyperlipidemia, unspecified: E78.5

## 2024-10-07 HISTORY — DX: Paroxysmal atrial fibrillation: I48.0

## 2024-10-07 HISTORY — DX: Unspecified atrial flutter: I48.92

## 2024-10-07 HISTORY — DX: Chronic kidney disease, stage 3 unspecified: N18.30

## 2024-10-07 LAB — CBC
HCT: 32.2 % — ABNORMAL LOW (ref 39.0–52.0)
Hemoglobin: 10.9 g/dL — ABNORMAL LOW (ref 13.0–17.0)
MCH: 31.7 pg (ref 26.0–34.0)
MCHC: 33.9 g/dL (ref 30.0–36.0)
MCV: 93.6 fL (ref 80.0–100.0)
Platelets: 305 K/uL (ref 150–400)
RBC: 3.44 MIL/uL — ABNORMAL LOW (ref 4.22–5.81)
RDW: 11.3 % — ABNORMAL LOW (ref 11.5–15.5)
WBC: 4.1 K/uL (ref 4.0–10.5)
nRBC: 0 % (ref 0.0–0.2)

## 2024-10-07 LAB — URINALYSIS, COMPLETE (UACMP) WITH MICROSCOPIC
Bacteria, UA: NONE SEEN
Bilirubin Urine: NEGATIVE
Glucose, UA: NEGATIVE mg/dL
Hgb urine dipstick: NEGATIVE
Ketones, ur: NEGATIVE mg/dL
Leukocytes,Ua: NEGATIVE
Nitrite: NEGATIVE
Protein, ur: 100 mg/dL — AB
Specific Gravity, Urine: 1.008 (ref 1.005–1.030)
Squamous Epithelial / HPF: 0 /HPF (ref 0–5)
pH: 6 (ref 5.0–8.0)

## 2024-10-07 LAB — TROPONIN T, HIGH SENSITIVITY
Troponin T High Sensitivity: 180 ng/L (ref 0–19)
Troponin T High Sensitivity: 159 ng/L (ref 0–19)

## 2024-10-07 LAB — BASIC METABOLIC PANEL WITH GFR
Anion gap: 9 (ref 5–15)
BUN: 38 mg/dL — ABNORMAL HIGH (ref 8–23)
CO2: 29 mmol/L (ref 22–32)
Calcium: 9.9 mg/dL (ref 8.9–10.3)
Chloride: 92 mmol/L — ABNORMAL LOW (ref 98–111)
Creatinine, Ser: 1.34 mg/dL — ABNORMAL HIGH (ref 0.61–1.24)
GFR, Estimated: 53 mL/min — ABNORMAL LOW
Glucose, Bld: 71 mg/dL (ref 70–99)
Potassium: 5.2 mmol/L — ABNORMAL HIGH (ref 3.5–5.1)
Sodium: 130 mmol/L — ABNORMAL LOW (ref 135–145)

## 2024-10-07 MED ORDER — SENNA 8.6 MG PO TABS
2.0000 | ORAL_TABLET | Freq: Every day | ORAL | Status: DC
  Administered 2024-10-07 – 2024-10-08 (×2): 17.2 mg via ORAL
  Filled 2024-10-07 (×2): qty 2

## 2024-10-07 MED ORDER — SODIUM CHLORIDE 0.9 % IV SOLN: INTRAVENOUS | Status: AC

## 2024-10-07 MED ORDER — ONDANSETRON HCL 4 MG/2ML IJ SOLN: 4.0000 mg | Freq: Four times a day (QID) | INTRAMUSCULAR | Status: DC | PRN

## 2024-10-07 MED ORDER — PATIROMER SORBITEX CALCIUM 8.4 G PO PACK
1.0000 | PACK | Freq: Every day | ORAL | Status: DC
  Administered 2024-10-08: 1 via ORAL
  Filled 2024-10-07: qty 1

## 2024-10-07 MED ORDER — PATIROMER SORBITEX CALCIUM 8.4 G PO PACK
16.8000 g | PACK | Freq: Every day | ORAL | Status: DC
  Administered 2024-10-07: 16.8 g via ORAL
  Filled 2024-10-07: qty 2

## 2024-10-07 MED ORDER — APIXABAN 2.5 MG PO TABS
2.5000 mg | ORAL_TABLET | Freq: Two times a day (BID) | ORAL | Status: DC
  Administered 2024-10-07 – 2024-10-08 (×2): 2.5 mg via ORAL
  Filled 2024-10-07 (×2): qty 1

## 2024-10-07 MED ORDER — TAMSULOSIN HCL 0.4 MG PO CAPS
0.4000 mg | ORAL_CAPSULE | Freq: Every day | ORAL | Status: DC
  Administered 2024-10-07: 0.4 mg via ORAL
  Filled 2024-10-07: qty 1

## 2024-10-07 MED ORDER — RILUZOLE 50 MG PO TABS: 50.0000 mg | ORAL_TABLET | Freq: Two times a day (BID) | ORAL | Status: DC

## 2024-10-07 MED ORDER — ACETAMINOPHEN 325 MG PO TABS: 650.0000 mg | ORAL_TABLET | Freq: Four times a day (QID) | ORAL | Status: DC | PRN

## 2024-10-07 MED ORDER — VITAMIN D 25 MCG (1000 UNIT) PO TABS
1000.0000 [IU] | ORAL_TABLET | Freq: Every day | ORAL | Status: DC
  Administered 2024-10-08: 1000 [IU] via ORAL
  Filled 2024-10-07: qty 1

## 2024-10-07 MED ORDER — ACETAMINOPHEN 650 MG RE SUPP: 650.0000 mg | Freq: Four times a day (QID) | RECTAL | Status: DC | PRN

## 2024-10-07 MED ORDER — ASPIRIN 81 MG PO TBEC: 81.0000 mg | DELAYED_RELEASE_TABLET | Freq: Every day | ORAL | Status: DC

## 2024-10-07 MED ORDER — METOPROLOL SUCCINATE ER 50 MG PO TB24
75.0000 mg | ORAL_TABLET | Freq: Every day | ORAL | Status: DC
  Administered 2024-10-08: 75 mg via ORAL
  Filled 2024-10-07: qty 1

## 2024-10-07 MED ORDER — ONDANSETRON HCL 4 MG PO TABS: 4.0000 mg | ORAL_TABLET | Freq: Four times a day (QID) | ORAL | Status: DC | PRN

## 2024-10-07 NOTE — Assessment & Plan Note (Signed)
 Continue metoprolol and apixaban

## 2024-10-07 NOTE — Assessment & Plan Note (Signed)
 Status post ablation Continue metoprolol  for rate control Continue apixaban  as primary prophylaxis for an acute stroke

## 2024-10-07 NOTE — Assessment & Plan Note (Signed)
 Patient was sent to the ER for evaluation of abnormal labs.  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

## 2024-10-07 NOTE — Assessment & Plan Note (Signed)
 Continue Riluzole  Follow-up with neurology as an outpatient

## 2024-10-07 NOTE — Assessment & Plan Note (Addendum)
 Noted to have elevated troponin levels in the setting of coronary artery disease Denies having any chest pain Elevated troponin levels may be secondary to demand ischemia and is currently flat Obtain 2D echocardiogram to rule out regional wall motion abnormality Continue apixaban  and metoprolol 

## 2024-10-07 NOTE — ED Provider Notes (Signed)
 "  [facility] Provider Note   Event Date/Time   First MD Initiated Contact with Patient 10/07/24 1332     (approximate) History  hyperkalemia  HPI Raymond Meyers. is a 84 y.o. male with a stated past medical history of CAD, hypertension, and ALS who presents after being told by the [facility] that his potassium was elevated to 5.8 on routine blood work yesterday.  Patient denies any complaints at this time specifically no chest pain or shortness of breath.  Patient denies any dyspnea on exertion as well.  Patient denies any palpitations ROS: Patient currently denies any vision changes, tinnitus, difficulty speaking, facial droop, sore throat, chest pain, shortness of breath, abdominal pain, nausea/vomiting/diarrhea, dysuria, or weakness/numbness/paresthesias in any extremity   Physical Exam  Triage Vital Signs: ED Triage Vitals  Encounter Vitals Group     BP 10/07/24 1313 123/71     Girls Systolic BP Percentile --      Girls Diastolic BP Percentile --      Boys Systolic BP Percentile --      Boys Diastolic BP Percentile --      Pulse Rate 10/07/24 1313 82     Resp 10/07/24 1313 16     Temp 10/07/24 1313 97.8 F (36.6 C)     Temp Source 10/07/24 1313 Oral     SpO2 10/07/24 1313 96 %     Weight 10/07/24 1314 134 lb 0.6 oz (60.8 kg)     Height --      Head Circumference --      Peak Flow --      Pain Score 10/07/24 1314 0     Pain Loc --      Pain Education --      Exclude from Growth Chart --    Most recent vital signs: Vitals:   10/07/24 1313  BP: 123/71  Pulse: 82  Resp: 16  Temp: 97.8 F (36.6 C)  SpO2: 96%   General: Awake, oriented x4. CV:  Good peripheral perfusion. Resp:  Normal effort. Abd:  No distention. Other:  Elderly well-developed, well-nourished African-American male resting comfortably in no acute distress ED Results / Procedures / Treatments  Labs (all labs ordered are listed, but only abnormal results are displayed) Labs  Reviewed  BASIC METABOLIC PANEL WITH GFR - Abnormal; Notable for the following components:      Result Value   Sodium 130 (*)    Potassium 5.2 (*)    Chloride 92 (*)    BUN 38 (*)    Creatinine, Ser 1.34 (*)    GFR, Estimated 53 (*)    All other components within normal limits  CBC - Abnormal; Notable for the following components:   RBC 3.44 (*)    Hemoglobin 10.9 (*)    HCT 32.2 (*)    RDW 11.3 (*)    All other components within normal limits  TROPONIN T, HIGH SENSITIVITY - Abnormal; Notable for the following components:   Troponin T High Sensitivity 180 (*)    All other components within normal limits  TROPONIN T, HIGH SENSITIVITY   EKG ED ECG REPORT I, Artist MARLA Kerns, the attending physician, personally viewed and interpreted this ECG. Date: 10/07/2024 EKG Time: 1327 Rate: 80 Rhythm: normal sinus rhythm QRS Axis: normal Intervals: normal ST/T Wave abnormalities: normal Narrative Interpretation: no evidence of acute ischemia RADIOLOGY ED MD interpretation: 2 views chest x-ray interpreted by me shows no evidence of acute abnormalities including no pneumonia,  pneumothorax, or widened mediastinum - All radiology independently interpreted and agree with radiology assessment Official radiology report(s): DG Chest 2 View Result Date: 10/07/2024 CLINICAL DATA:  Hyperkalemia EXAM: CHEST - 2 VIEW COMPARISON:  None Available. FINDINGS: The heart size and mediastinal contours are within normal limits. Both lungs are clear. The visualized skeletal structures are unremarkable. IMPRESSION: No active cardiopulmonary disease. Electronically Signed   By: Lynwood Landy Raddle M.D.   On: 10/07/2024 14:41   PROCEDURES: Critical Care performed: No Procedures MEDICATIONS ORDERED IN ED: Medications  patiromer  (VELTASSA ) packet 16.8 g (has no administration in time range)   IMPRESSION / MDM / ASSESSMENT AND PLAN / ED COURSE  I reviewed the triage vital signs and the nursing notes.                              The patient is on the cardiac monitor to evaluate for evidence of arrhythmia and/or significant heart rate changes. Patient's presentation is most consistent with acute presentation with potential threat to life or bodily function. Patient is an 84 year old male with the above-stated past medical history who presents after being told by the [facility] that his potassium was 5.8 to present to the emergency department.  Patient has no complaints at this time.  Initial EKG showing possible acute STEMI however patient denies any chest pain currently or within the past few weeks.  Patient's potassium came back mildly elevated at 5.2 and will give Veltassa  p.o.  Patient also incidentally had a troponin of 180 on initial and will need repeat.  I spoke to Dr. Medford End cardiology who agrees with plan for repeat EKG and troponin prior to determination for need of intervention  Care of this patient will be signed out to the oncoming physician at the end of my shift.  All pertinent patient information conveyed and all questions answered.  All further care and disposition decisions will be made by the oncoming physician.   FINAL CLINICAL IMPRESSION(S) / ED DIAGNOSES   Final diagnoses:  Hyperkalemia   Rx / DC Orders   ED Discharge Orders     None      Note:  This document was prepared using Dragon voice recognition software and may include unintentional dictation errors.   Tamiko Leopard K, MD 10/07/24 1513  "

## 2024-10-07 NOTE — H&P (Signed)
 " History and Physical    Patient: Raymond Meyers. FMW:[id number] DOB: August 04, 1941 DOA: 10/07/2024 DOS: the patient was seen and examined on 10/07/2024 PCP: Majorie Danelle LABOR, MD  Patient coming from: Home  Chief Complaint:  Chief Complaint  Patient presents with   hyperkalemia   HPI: Raymond Meyers. is a 84 y.o. male with medical history significant for ALS (diagnosed 09/25), history of A-fib status post ablation on apixaban who was referred to the emergency room by his primary care provider for abnormal lab results. Patient states that he had blood work done at the [facility] and his wife was contacted and advised to bring him to the ER because his potassium level was 5.8. He complains of shortness of breath but notes that he has had this since he was diagnosed with ALS. He denied having any chest pain, no nausea, no vomiting, no abdominal pain, no changes in his bowel habits, no fever, no chills, no cough, no urinary symptoms, no blurred vision no focal deficit. Repeat labs showed potassium of 5.2, troponin of 180 >> 159, sodium of 130, BUN 38, creatinine 1.34 Chest x-ray reviewed by me showed clear lungs Twelve-lead EKG reviewed by me showed normal sinus rhythm with LVH Patient will be referred to observation status for evaluation of elevated troponin levels    Review of Systems: As mentioned in the history of present illness. All other systems reviewed and are negative. Past Medical History:  Diagnosis Date   ALS (amyotrophic lateral sclerosis) (HCC)    Coronary artery disease    History reviewed. No pertinent surgical history. Social History:  reports that he has never smoked. He has never used smokeless tobacco. He reports that he does not currently use alcohol. No history on file for drug use.  Allergies[1]  History reviewed. No pertinent family history.  Prior to Admission medications  Medication Sig Start Date End Date Taking? Authorizing Provider  apixaban (ELIQUIS) 2.5 MG  TABS tablet Take 2.5 mg by mouth 2 (two) times daily. 08/05/22  Yes [provider]  metoprolol succinate (TOPROL-XL) 50 MG 24 hr tablet Take 75 mg by mouth daily. 03/01/20  Yes [provider]  senna (SENOKOT) 8.6 MG tablet Take 2 tablets by mouth daily. 07/27/24 07/27/25 Yes [provider]  tamsulosin (FLOMAX) 0.4 MG CAPS capsule Take 0.4 mg by mouth daily after supper. 07/27/24 07/27/25 Yes [provider]  Cholecalciferol 50 MCG (2000 UT) TABS Take 1 tablet by mouth daily.    [provider]  lisinopril (ZESTRIL) 5 MG tablet Take 5 mg by mouth daily.    [provider]  riluzole (RILUTEK) 50 MG tablet Take 50 mg by mouth 2 (two) times daily.    [provider]  VELTASSA 8.4 g packet Take 1 packet by mouth daily.    [provider]    Physical Exam: Vitals:   10/07/24 1313 10/07/24 1314 10/07/24 1622  BP: 123/71  (!) 150/80  Pulse: 82  72  Resp: 16  15  Temp: 97.8 F (36.6 C)  98 F (36.7 C)  TempSrc: Oral  Oral  SpO2: 96%  99%  Weight:  60.8 kg    Physical Exam Vitals and nursing note reviewed.  Constitutional:      Appearance: Normal appearance.     Comments: Chronically ill-appearing  HENT:     Nose: Nose normal.     Mouth/Throat:     Mouth: Mucous membranes are moist.  Eyes:  Comments: Pale conjunctiva  Cardiovascular:     Rate and Rhythm: Normal rate and regular rhythm.  Pulmonary:     Effort: Pulmonary effort is normal.     Breath sounds: Normal breath sounds.  Abdominal:     General: Abdomen is flat. Bowel sounds are normal.     Palpations: Abdomen is soft.  Musculoskeletal:        General: Normal range of motion.     Cervical back: Normal range of motion and neck supple.  Skin:    General: Skin is warm and dry.  Neurological:     Mental Status: He is alert and oriented to person, place, and time.     Motor: Weakness present.  Psychiatric:        Mood and Affect: Mood normal.         Behavior: Behavior normal.     Data Reviewed: Data Reviewed: Relevant notes from primary care and specialist visits, past discharge summaries as available in EHR, including Care Everywhere. Prior diagnostic testing as pertinent to current admission diagnoses Updated medications and problem lists for reconciliation ED course, including vitals, labs, imaging, treatment and response to treatment Triage notes, nursing and pharmacy notes and ED provider's notes Notable results as noted in HPI Troponin 180 >>159, sodium 130, potassium 5.2, chloride 92, bicarb 29, glucose 71, BUN 38, creatinine 1.34, calcium 9.9, white count 4.1, hemoglobin 10.9, hematocrit 32.2, platelet count 305  There are no new results to review at this time.  Assessment and Plan: * Hyperkalemia Patient was sent to the ER for evaluation of abnormal labs.  He was said to have potassium levels of 5.8 Repeat potassium level here is 5.2 Elevated potassium levels may be secondary to type IV renal tubular acidosis Patient was on an ACE inhibitor which has since been discontinued Continue Veltassa May benefit from sodium bicarbonate tablets  BPH (benign prostatic hyperplasia) Continue Flomax  Coronary artery disease Continue metoprolol and apixaban  ALS (amyotrophic lateral sclerosis) (HCC) Continue Riluzole Follow-up with neurology as an outpatient  Paroxysmal atrial fibrillation (HCC) Status post ablation Continue metoprolol for rate control Continue apixaban as primary prophylaxis for an acute stroke  Elevated troponin Noted to have elevated troponin levels in the setting of coronary artery disease Denies having any chest pain Elevated troponin levels may be secondary to demand ischemia and is currently flat Obtain 2D echocardiogram to rule out regional wall motion abnormality Continue apixaban and metoprolol       Advance Care Planning:   Code Status: Full Code   Consults: Cardiology  Family  Communication: Plan of care was discussed with patient and his wife at the bedside.  They verbalized understanding and agree with the plan.  Severity of Illness: The appropriate patient status for this patient is OBSERVATION. Observation status is judged to be reasonable and necessary in order to provide the required intensity of service to ensure the patient's safety. The patient's presenting symptoms, physical exam findings, and initial radiographic and laboratory data in the context of their medical condition is felt to place them at decreased risk for further clinical deterioration. Furthermore, it is anticipated that the patient will be medically stable for discharge from the hospital within 2 midnights of admission.   Author: Aimee Somerset, MD 10/07/2024 6:36 PM  For on call review [email or web address].      [1] No Known Allergies  "

## 2024-10-07 NOTE — ED Notes (Signed)
 Pt given water, graham crackers, and applesauce. Pt family member given a cup of coffee, and I confirmed that the coffee was for her and not the pt. No further requests at this time.

## 2024-10-07 NOTE — Assessment & Plan Note (Signed)
 Continue Flomax 

## 2024-10-07 NOTE — ED Provider Notes (Signed)
 Procedures     ----------------------------------------- 5:40 PM on 10/07/2024 ----------------------------------------- Repeat troponin stable/slightly downtrending.  Patient remains asymptomatic in the ED.  On further discussion with him, he has been having increased dyspnea on exertion over the last few days.  Would recommend further cardiac workup in the hospital which patient agrees to.  Case discussed with hospitalist.     Viviann Pastor, MD 10/07/24 [phone]

## 2024-10-07 NOTE — ED Triage Notes (Signed)
 Presents to ED for hyperkalemia. States had blood work drawn yesterday at [facility] and were called today stating the K: 5.8.  AAOx3. Skin warm and dry NAD

## 2024-10-07 NOTE — Progress Notes (Signed)
" ° °  Brief Progress Note   _____________________________________________________________________________________________________________  Patient Name: Raymond Meyers. Patient DOB: 06-05-1941 Date: @TODAY @      Data: Patient has admission orders for medical-surgical level of care with cardiac monitoring. Noted elevated troponin with history of coronary artery disease and hypertension.   Action: Contacted Dr. Lanetta to clarify appropriate level of care and need for cardiac telemetry bed.   Response:  Dr. Lanetta updated admission orders to reflect cardiac tele level of care.  _____________________________________________________________________________________________________________  [facility] RN Expeditor [place] S [place] Willis Please contact us  directly via secure chat (search for [facility]) or by calling us  at [phone] [facility]).  "

## 2024-10-08 ENCOUNTER — Other Ambulatory Visit: Payer: Self-pay

## 2024-10-08 ENCOUNTER — Encounter: Payer: Self-pay | Admitting: Internal Medicine

## 2024-10-08 ENCOUNTER — Observation Stay: Admit: 2024-10-08 | Discharge: 2024-10-08 | Disposition: A | Attending: Internal Medicine | Admitting: Internal Medicine

## 2024-10-08 DIAGNOSIS — G1221 Amyotrophic lateral sclerosis: Secondary | ICD-10-CM

## 2024-10-08 DIAGNOSIS — R0602 Shortness of breath: Secondary | ICD-10-CM | POA: Diagnosis not present

## 2024-10-08 DIAGNOSIS — R7989 Other specified abnormal findings of blood chemistry: Secondary | ICD-10-CM | POA: Diagnosis not present

## 2024-10-08 DIAGNOSIS — N183 Chronic kidney disease, stage 3 unspecified: Secondary | ICD-10-CM

## 2024-10-08 DIAGNOSIS — E875 Hyperkalemia: Secondary | ICD-10-CM | POA: Diagnosis not present

## 2024-10-08 DIAGNOSIS — I4891 Unspecified atrial fibrillation: Secondary | ICD-10-CM | POA: Diagnosis not present

## 2024-10-08 LAB — ECHOCARDIOGRAM COMPLETE
AR max vel: 3.35 cm2
AV Peak grad: 1.3 mmHg
Ao pk vel: 0.58 m/s
Area-P 1/2: 4.04 cm2
Calc EF: 60.9 %
S' Lateral: 2.5 cm
Single Plane A2C EF: 59.9 %
Single Plane A4C EF: 60.1 %
Weight: 2144.63 [oz_av]

## 2024-10-08 LAB — BASIC METABOLIC PANEL WITH GFR
Anion gap: 7 (ref 5–15)
BUN: 34 mg/dL — ABNORMAL HIGH (ref 8–23)
CO2: 29 mmol/L (ref 22–32)
Calcium: 9.5 mg/dL (ref 8.9–10.3)
Chloride: 97 mmol/L — ABNORMAL LOW (ref 98–111)
Creatinine, Ser: 1.36 mg/dL — ABNORMAL HIGH (ref 0.61–1.24)
GFR, Estimated: 52 mL/min — ABNORMAL LOW
Glucose, Bld: 83 mg/dL (ref 70–99)
Potassium: 4.7 mmol/L (ref 3.5–5.1)
Sodium: 132 mmol/L — ABNORMAL LOW (ref 135–145)

## 2024-10-08 LAB — CBC
HCT: 29.8 % — ABNORMAL LOW (ref 39.0–52.0)
Hemoglobin: 10 g/dL — ABNORMAL LOW (ref 13.0–17.0)
MCH: 31.2 pg (ref 26.0–34.0)
MCHC: 33.6 g/dL (ref 30.0–36.0)
MCV: 92.8 fL (ref 80.0–100.0)
Platelets: 290 10*3/uL (ref 150–400)
RBC: 3.21 MIL/uL — ABNORMAL LOW (ref 4.22–5.81)
RDW: 11 % — ABNORMAL LOW (ref 11.5–15.5)
WBC: 5 10*3/uL (ref 4.0–10.5)
nRBC: 0 % (ref 0.0–0.2)

## 2024-10-08 LAB — TSH: TSH: 2.4 u[IU]/mL (ref 0.350–4.500)

## 2024-10-08 LAB — MAGNESIUM: Magnesium: 1.9 mg/dL (ref 1.7–2.4)

## 2024-10-08 MED ORDER — PERFLUTREN LIPID MICROSPHERE
1.0000 mL | INTRAVENOUS | Status: AC | PRN
  Administered 2024-10-08: 1 mL via INTRAVENOUS

## 2024-10-08 MED ORDER — METOPROLOL SUCCINATE ER 25 MG PO TB24
75.0000 mg | ORAL_TABLET | Freq: Every day | ORAL | 0 refills | Status: AC
  Filled 2024-10-08: qty 100, 33d supply, fill #0

## 2024-10-08 NOTE — Progress Notes (Signed)
 Notified wife Gurkirat Basher patient was moving to [address]

## 2024-10-08 NOTE — Consult Note (Signed)
 "  Cardiology Consultation:   Patient ID: Raymond Meyers.; [id number]; [birth year]   Admit date: 10/07/2024 Date of Consult: 10/08/2024  Primary Care Provider: Majorie Danelle LABOR, MD [facility] Cardiologist: [facility] Primary Electrophysiologist:  None   Patient Profile:   Raymond Meyers. is a 84 y.o. male with a hx of ALS, atrial flutter status post ablation in 2015 with subsequent development of A-fib, syncope, HTN, CKD stage III, anemia of chronic disease, degenerative disc disease, and chronic left foot discoloration who is being seen today for the evaluation of elevated troponin at the request of Dr. Lanetta.  History of Present Illness:   Raymond Meyers underwent atrial flutter ablation in 2015 with subsequent development of A-fib thereafter captured on outpatient cardiac monitoring for evaluation of syncope demonstrating a 25% A-fib burden.  Most recent echo from 01/2023 showed an EF greater than 55%, mildly increased LV wall thickness, mild mitral regurgitation, mildly dilated left atrium, normal RV systolic function and ventricular cavity size, and a small posterior pericardial effusion without evidence of tamponade physiology.  He presented to [facility] on 10/07/2024 after having been found to have hyperkalemia with a potassium of 5.8 by outpatient labs at the [facility].  Hemodynamically stable with BP ranging from the 120s to 170s mmHg systolic with heart rates in the 70s to 80s bpm, oxygen saturation 99% on room air, and afebrile.  Upon presentation he reported chronic shortness of breath that has been present since diagnosis of ALS.  Initial EKG showed sinus rhythm, 80 bpm, baseline artifact affecting V3, mild diffuse ST elevation versus LVH with early repolarization abnormality, and T wave inversion in aVL.  Several repeat EKGs continued to show sinus rhythm with nonspecific ST-T changes and baseline wandering along V3.  Repeat labs showed a repeat potassium of 5.2 trending to 4.7, initial troponin 180 with a  delta troponin 157, sodium 130 trending to 132, serum creatinine 1.34, Hgb 10.9 chest x-ray showed no active cardiopulmonary disease.  In the ER he received Veltassa  x 1 and was continued on PTA apixaban  2.5 mg twice daily (serum creatinine has previously been greater than 1.5 with weight trend typically less than 60 kg).  Cardiology is consulted for elevated troponin.  On telemetry, he has had intermittent paroxysms of Afib/flutter with controlled ventricular response, currently in Afib/flutter with rates in the 80s bpm.  Currently, he is without symptoms of angina or dyspnea.  No palpitations, dizziness, presyncope, or syncope.  No lower extremity swelling or progressive orthopnea.  He is uncertain if he has missed any doses of apixaban  over the past 4 weeks, indicating he sometimes forgets to take the evening dose.  He is unaware if he is currently in A-fib/flutter.  Reports he had some mild shortness of breath for 2 to 3 days prior to admission, though indicates this has resolved and is back to baseline.    Past Medical History:  Diagnosis Date   ALS (amyotrophic lateral sclerosis) (HCC)    Anemia of chronic disease    Atrial flutter (HCC)    Chronic kidney disease (CKD), stage III (moderate) (HCC)    Hyperkalemia    Hyperlipidemia    Hypertension    PAF (paroxysmal atrial fibrillation) (HCC)     History reviewed. No pertinent surgical history.   Home Meds: Prior to Admission medications  Medication Sig Start Date End Date Taking? Authorizing Provider  apixaban  (ELIQUIS ) 2.5 MG TABS tablet Take 2.5 mg by mouth 2 (two) times daily. 08/05/22  Yes [provider]  Cholecalciferol  50 MCG (2000 UT) TABS Take 1 tablet by mouth daily.   Yes [provider]  metoprolol  succinate (TOPROL -XL) 50 MG 24 hr tablet Take 75 mg by mouth daily. 03/01/20  Yes [provider]  senna (SENOKOT) 8.6 MG tablet Take 2 tablets by mouth daily. 07/27/24 07/27/25 Yes [provider]  tamsulosin  (FLOMAX ) 0.4 MG CAPS capsule Take 0.4 mg by mouth daily after supper. 07/27/24 07/27/25 Yes [provider]  VELTASSA  8.4 g packet Take 1 packet by mouth daily.   Yes [provider]  lisinopril (ZESTRIL) 5 MG tablet Take 5 mg by mouth daily. Patient not taking: Reported on 10/07/2024    [provider]  riluzole  (RILUTEK ) 50 MG tablet Take 50 mg by mouth 2 (two) times daily. Patient not taking: Reported on 10/07/2024    [provider]    Inpatient Medications: Scheduled Meds:  apixaban   2.5 mg Oral BID   cholecalciferol   1,000 Units Oral Daily   metoprolol  succinate  75 mg Oral Daily   patiromer   1 packet Oral Daily   senna  2 tablet Oral Daily   tamsulosin   0.4 mg Oral QPC supper   Continuous Infusions:  PRN Meds: acetaminophen  **OR** acetaminophen , ondansetron  **OR** ondansetron  (ZOFRAN ) IV  Allergies:  Allergies[1]  Social History:   Social History   Socioeconomic History   Marital status: Married    Spouse name: Not on file   Number of children: Not on file   Years of education: Not on file   Highest education level: Not on file  Occupational History   Not on file  Tobacco Use   Smoking status: Never   Smokeless tobacco: Never  Substance and Sexual Activity   Alcohol use: Not Currently   Drug use: Not on file   Sexual activity: Not Currently  Other Topics Concern   Not on file  Social History Narrative   Not on file   Social Drivers of Health   Tobacco Use: Low Risk (10/07/2024)   Patient History    Smoking Tobacco Use: Never    Smokeless Tobacco Use: Never    Passive Exposure: Not on file  Financial Resource Strain: Not on file  Food Insecurity: No Food Insecurity (10/08/2024)   Epic    Worried About Programme Researcher, Broadcasting/film/video in the Last Year: Never true    Ran Out of Food in the Last Year: Never true  Transportation Needs: No Transportation Needs (10/08/2024)   Epic    Lack of Transportation (Medical): No     Lack of Transportation (Non-Medical): No  Physical Activity: Not on file  Stress: Not on file  Social Connections: Socially Integrated (10/08/2024)   Social Connection and Isolation Panel    Frequency of Communication with Friends and Family: Three times a week    Frequency of Social Gatherings with Friends and Family: Three times a week    Attends Religious Services: 1 to 4 times per year    Active Member of Clubs or Organizations: Yes    Attends Banker Meetings: 1 to 4 times per year    Marital Status: Married  Catering Manager Violence: Not At Risk (10/08/2024)   Epic    Fear of Current or Ex-Partner: No    Emotionally Abused: No    Physically Abused: No    Sexually Abused: No  Depression (PHQ2-9): Not on file  Alcohol Screen: Not on file  Housing: Low Risk (10/08/2024)   Epic  Unable to Pay for Housing in the Last Year: No    Number of Times Moved in the Last Year: 0    Homeless in the Last Year: No  Utilities: Not At Risk (10/08/2024)   Epic    Threatened with loss of utilities: No  Health Literacy: Not on file     Family History:   Family History  Problem Relation Age of Onset   Heart disease Father     ROS:  Review of Systems  Constitutional:  Positive for malaise/fatigue and weight loss. Negative for chills, diaphoresis and fever.  HENT:  Negative for congestion.   Eyes:  Negative for discharge and redness.  Respiratory:  Positive for shortness of breath. Negative for cough, sputum production and wheezing.   Cardiovascular:  Negative for chest pain, palpitations, orthopnea, claudication, leg swelling and PND.  Gastrointestinal:  Negative for abdominal pain, blood in stool, heartburn, melena, nausea and vomiting.  Musculoskeletal:  Negative for falls and myalgias.  Skin:  Negative for rash.  Neurological:  Positive for weakness. Negative for dizziness, tingling, tremors, sensory change, speech change, focal weakness and loss of consciousness.   Endo/Heme/Allergies:  Does not bruise/bleed easily.  Psychiatric/Behavioral:  Negative for substance abuse. The patient is not nervous/anxious.       Physical Exam/Data:   Vitals:   10/08/24 0200 10/08/24 0300 10/08/24 0348 10/08/24 0825  BP: (!) 157/96 (!) 151/83 (!) 153/78 126/80  Pulse: 78 74 73 84  Resp: 14 (!) 24 20 18   Temp:   97.8 F (36.6 C) 98.1 F (36.7 C)  TempSrc:      SpO2: 100% 99% 100% 99%  Weight:        Intake/Output Summary (Last 24 hours) at 10/08/2024 0906 Last data filed at 10/08/2024 0326 Gross per 24 hour  Intake 333.25 ml  Output 800 ml  Net -466.75 ml   Filed Weights   10/07/24 1314  Weight: 60.8 kg   Body mass index is 18.18 kg/m.   Physical Exam: General: Thin, in no acute distress. Head: Normocephalic, atraumatic, sclera non-icteric, no xanthomas, nares without discharge.  Neck: Negative for carotid bruits. JVD not elevated. Lungs: Clear bilaterally to auscultation without wheezes, rales, or rhonchi. Breathing is unlabored. Heart: IRIR with S1 S2. No murmurs, rubs, or gallops appreciated. Abdomen: Soft, non-tender, non-distended with normoactive bowel sounds. No hepatomegaly. No rebound/guarding. No obvious abdominal masses. Msk:  Strength and tone appear normal for age. Extremities: No clubbing or cyanosis. No edema. Distal pedal pulses are 2+ and equal bilaterally. Neuro: Alert and oriented X 3. No facial asymmetry. No focal deficit. Moves all extremities spontaneously. Psych:  Responds to questions appropriately with a normal affect.   EKG:  The EKG was personally reviewed and demonstrates: sinus rhythm with nonspecific ST-T changes and baseline wandering along V3 Telemetry:  Telemetry was personally reviewed and demonstrates: Intermittent paroxysms of Afib/flutter with controlled ventricular response, currently in Afib/flutter  Weights: Filed Weights   10/07/24 1314  Weight: 60.8 kg    Relevant CV Studies:  2D echo 01/29/2023  [facility]): Summary   1. The left ventricle is normal in size with moderately increased wall  thickness.   2. The left ventricular systolic function is normal, LVEF is visually  estimated at > 55%.    3. There is mild mitral valve regurgitation.    4. The left atrium is mildly dilated in size.    5. The right ventricle is upper normal in size, with normal systolic  function.  6. There is mild, eccentric pulmonic regurgitation.    7. There is a small, posterior pericardial effusion.  No tamponade  physiology.   Laboratory Data:  Chemistry Recent Labs  Lab 10/07/24 1320 10/08/24 0459  NA 130* 132*  K 5.2* 4.7  CL 92* 97*  CO2 29 29  GLUCOSE 71 83  BUN 38* 34*  CREATININE 1.34* 1.36*  CALCIUM  9.9 9.5  GFRNONAA 53* 52*  ANIONGAP 9 7    No results for input(s): PROT, ALBUMIN, AST, ALT, ALKPHOS, BILITOT in the last 168 hours. Hematology Recent Labs  Lab 10/07/24 1320 10/08/24 0459  WBC 4.1 5.0  RBC 3.44* 3.21*  HGB 10.9* 10.0*  HCT 32.2* 29.8*  MCV 93.6 92.8  MCH 31.7 31.2  MCHC 33.9 33.6  RDW 11.3* 11.0*  PLT 305 290   Cardiac EnzymesNo results for input(s): TROPONINI in the last 168 hours. No results for input(s): TROPIPOC in the last 168 hours.  BNPNo results for input(s): BNP, PROBNP in the last 168 hours.  DDimer No results for input(s): DDIMER in the last 168 hours.  Radiology/Studies:  DG Chest 2 View Result Date: 10/07/2024 IMPRESSION: No active cardiopulmonary disease. Electronically Signed   By: Lynwood Landy Raddle M.D.   On: 10/07/2024 14:41    Assessment and Plan:   1. Elevated high-sensitivity troponin:  - Patient never with chest pain, primary reason for ER presentation was abnormal labs showing hyperkalemia in the outpatient setting - Reports a 2 to 3-day history of mild shortness of breath prior to presentation that is currently resolved, query if this is in the setting of paroxysmal A-fib/flutter, though he is completely  asymptomatic at time of cardiology consult while in documented A-fib - Mildly elevated and downtrending, not consistent with ACS  - Defer initiation of heparin drip without dynamic elevation of troponin  - Obtain echo with further recommendations pending  - PTA apixaban  in lieu of aspirin   2. A-fib/flutter: - Sinus rhythm upon presentation with development of rate controlled A-fib/flutter on the morning of 1/24 that has been paroxysmal and currently in A-fib/flutter - He is uncertain if he has missed any doses of apixaban  over the past 4 weeks - PTA Toprol -XL 75 mg for now - Should he have frequent paroxysms of A-fib/flutter down the road, after he has been adequately anticoagulated without interruption, could consider addition of antiarrhythmic therapy to minimize atrial arrhythmia burden, this can be deferred to his primary cardiology team - Remains on apixaban  2.5 mg twice daily as he typically meets reduced dosing criteria with age greater than 80 and weight typically less than 60 kg, also has previously had serum creatinine greater than 1.5  - Check TSH and magnesium  3. CKD stage III with hyperkalemia:  - Renal function stable  - Hyperkalemia improved  - Avoid nephrotoxic agents  - Management per IM     For questions or updates, please contact [facility] Please consult [email or web address] for contact info under Cardiology/STEMI.   Signed, Bernardino Bring, PA-C [facility] Pager: [phone] 10/08/2024, 9:06 AM       [1] No Known Allergies  "

## 2024-10-08 NOTE — Plan of Care (Signed)

## 2024-10-08 NOTE — Progress Notes (Signed)
 Echocardiogram 2D Echocardiogram has been performed.  Raymond Meyers,RDCS 10/08/2024, 11:12 AM

## 2024-10-08 NOTE — Discharge Summary (Signed)
 " Physician Discharge Summary   Patient: Raymond Meyers. MRN: [id number] DOB: 1941/05/23  Admit date:     10/07/2024  Discharge date: 10/08/24  Discharge Physician: Murvin Mana   PCP: Majorie Danelle LABOR, MD   Recommendations at discharge:   Follow-up with PCP in 1 week. Follow-up with his cardiologist in [facility] as previous scheduled.  Discharge Diagnoses: Principal Problem:   Hyperkalemia Active Problems:   Elevated troponin   Paroxysmal atrial fibrillation (HCC)   ALS (amyotrophic lateral sclerosis) (HCC)   Coronary artery disease   BPH (benign prostatic hyperplasia) Chronic kidney disease stage IIIa. Hyponatremia. Resolved Problems:   * No resolved hospital problems. *  Hospital Course: Raymond Meyers. is a 84 y.o. male with medical history significant for ALS (diagnosed 09/25), history of A-fib status post ablation on apixaban  who was referred to the emergency room by his primary care provider for abnormal lab results.  Upon arriving the hospital, patient potassium was 5.2.  Prior potassium of 5.8 as outpatient may be a lab variation.  Patient was restarted on home dose of Veltassa , potassium has dropped down to 4.7 today.  It appears that the patient does not need additional dose adjustment.  Will continue home dose Veltassa .  Patient also had an elevated troponin associated with atrial fibrillation, peaked at 159.  Seen by cardiology, feel this is demand ischemia from atrial fibrillation.  Patient referred back to cardiology as outpatient.        Consultants: Cardiology Procedures performed: None  Disposition: Home Diet recommendation:  Discharge Diet Orders (From admission, onward)     Start     Ordered   10/08/24 0000  Diet - low sodium heart healthy        10/08/24 1256           Cardiac diet DISCHARGE MEDICATION: Allergies as of 10/08/2024   No Known Allergies      Medication List     STOP taking these medications    lisinopril 5 MG  tablet Commonly known as: ZESTRIL   riluzole  50 MG tablet Commonly known as: RILUTEK        TAKE these medications    apixaban  2.5 MG Tabs tablet Commonly known as: ELIQUIS  Take 2.5 mg by mouth 2 (two) times daily.   Cholecalciferol  50 MCG (2000 UT) Tabs Take 1 tablet by mouth daily.   metoprolol  succinate 25 MG 24 hr tablet Commonly known as: TOPROL -XL Take 3 tablets (75 mg total) by mouth daily. Take with or immediately following a meal. Start taking on: October 09, 2024 What changed:  medication strength additional instructions   senna 8.6 MG tablet Commonly known as: SENOKOT Take 2 tablets by mouth daily.   tamsulosin  0.4 MG Caps capsule Commonly known as: FLOMAX  Take 0.4 mg by mouth daily after supper.   Veltassa  8.4 g packet Generic drug: patiromer  Take 1 packet by mouth daily.        Follow-up Information     Majorie Danelle LABOR, MD Follow up in 1 week(s).   Specialty: Geriatric Medicine Contact information: [id number] [facility] [address] [phone]                Discharge Exam: Raymond Meyers   10/07/24 1314  Weight: 60.8 kg   General exam: Appears calm and comfortable  Respiratory system: Clear to auscultation. Respiratory effort normal. Cardiovascular system: S1 & S2 heard, RRR. No JVD, murmurs, rubs, gallops or clicks. No  pedal edema. Gastrointestinal system: Abdomen is nondistended, soft and nontender. No organomegaly or masses felt. Normal bowel sounds heard. Central nervous system: Alert and oriented. No focal neurological deficits. Extremities: Symmetric 5 x 5 power. Skin: No rashes, lesions or ulcers Psychiatry: Judgement and insight appear normal. Mood & affect appropriate.    Condition at discharge: fair  The results of significant diagnostics from this hospitalization (including imaging, microbiology, ancillary and laboratory) are listed below for reference.   Imaging  Studies: ECHOCARDIOGRAM COMPLETE Result Date: 10/08/2024    ECHOCARDIOGRAM REPORT   Patient Name:   Raymond Meyers. Date of Exam: 10/08/2024 Medical Rec #:  [id number]          Height:       72.0 in Accession #:    [id number]         Weight:       134.0 lb Date of Birth:  Jun 27, 1941          BSA:          1.797 m Patient Age:    83 years           BP:           123/64 mmHg Patient Gender: M                  HR:           76 bpm. Exam Location:  [facility] Procedure: 2D Echo, Cardiac Doppler, Color Doppler and Intracardiac            Opacification Agent (Both Spectral and Color Flow Doppler were            utilized during procedure). Indications:     Elevated Troponin  History:         Patient has no prior history of Echocardiogram examinations.                  Arrythmias:Atrial Flutter and Paraoxymal A-Fib; Risk                  Factors:Hypertension, Dyslipidemia and Hyperkalemia. CKD Stage                  3.  Sonographer:     Logan Shove RDCS Referring Phys:  [id number] UNRYLXTL AGBATA Diagnosing Phys: Annabella Scarce MD IMPRESSIONS  1. Left ventricular ejection fraction, by estimation, is 55 to 60%. The left ventricle has normal function. The left ventricle has no regional wall motion abnormalities. There is severe concentric left ventricular hypertrophy. Left ventricular diastolic  parameters are consistent with Grade II diastolic dysfunction (pseudonormalization).  2. Right ventricular systolic function is normal. The right ventricular size is mildly enlarged. There is normal pulmonary artery systolic pressure.  3. Right atrial size was mildly dilated.  4. The mitral valve is normal in structure. Trivial mitral valve regurgitation. No evidence of mitral stenosis.  5. The aortic valve is tricuspid. Aortic valve regurgitation is not visualized. No aortic stenosis is present.  6. 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  7. The inferior vena cava is normal in size with greater than 50% respiratory variability,  suggesting right atrial pressure of 3 mmHg. FINDINGS  Left Ventricle: Left ventricular ejection fraction, by estimation, is 55 to 60%. The left ventricle has normal function. The left ventricle has no regional wall motion abnormalities. Definity  contrast agent was given IV to delineate the left ventricular  endocardial borders. The left ventricular internal cavity size was normal in size. There is  severe concentric left ventricular hypertrophy. Left ventricular diastolic parameters are consistent with Grade II diastolic dysfunction (pseudonormalization).  LV Wall Scoring: There is diffuse normal. Right Ventricle: The right ventricular size is mildly enlarged. No increase in right ventricular wall thickness. Right ventricular systolic function is normal. There is normal pulmonary artery systolic pressure. The tricuspid regurgitant velocity is 2.61  m/s, and with an assumed right atrial pressure of 3 mmHg, the estimated right ventricular systolic pressure is 30.2 mmHg. Left Atrium: Left atrial size was normal in size. Right Atrium: Right atrial size was mildly dilated. Pericardium: There is no evidence of pericardial effusion. Mitral Valve: The mitral valve is normal in structure. Trivial mitral valve regurgitation. No evidence of mitral valve stenosis. Tricuspid Valve: The tricuspid valve is normal in structure. Tricuspid valve regurgitation is mild . No evidence of tricuspid stenosis. Aortic Valve: The aortic valve is tricuspid. Aortic valve regurgitation is not visualized. No aortic stenosis is present. Aortic valve peak gradient measures 1.3 mmHg. Pulmonic Valve: The pulmonic valve was normal in structure. 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 No evidence of pulmonic stenosis. Aorta: The aortic root is normal in size and structure. Venous: The inferior vena cava is normal in size with greater than 50% respiratory variability, suggesting right atrial pressure of 3 mmHg. IAS/Shunts: No atrial level  shunt detected by color flow Doppler.  LEFT VENTRICLE PLAX 2D LVIDd:         4.00 cm     Diastology LVIDs:         2.50 cm     LV e' medial:    8.92 cm/s LV PW:         0.92 cm     LV E/e' medial:  9.6 LV IVS:        1.01 cm     LV e' lateral:   10.20 cm/s LVOT diam:     2.10 cm     LV E/e' lateral: 8.4 LVOT Area:     3.46 cm LV IVRT:       81 msec  LV Volumes (MOD) LV vol d, MOD A2C: 62.4 ml LV vol d, MOD A4C: 71.6 ml LV vol s, MOD A2C: 25.0 ml LV vol s, MOD A4C: 28.6 ml LV SV MOD A2C:     37.4 ml LV SV MOD A4C:     71.6 ml LV SV MOD BP:      41.4 ml RIGHT VENTRICLE            IVC RV Basal diam:  3.90 cm    IVC diam: 1.30 cm RV S prime:     7.51 cm/s TAPSE (M-mode): 1.3 cm LEFT ATRIUM           Index        RIGHT ATRIUM           Index LA diam:      3.10 cm 1.72 cm/m   RA Area:     19.20 cm LA Vol (A4C): 51.6 ml 28.71 ml/m  RA Volume:   53.20 ml  29.60 ml/m  AORTIC VALVE AV Area (Vmax): 3.35 cm AV Vmax:        57.70 cm/s AV Peak Grad:   1.3 mmHg LVOT Vmax:      55.80 cm/s  PULMONARY ARTERY                             MPA diam:  2.70 cm AORTA Ao Root diam: 3.20 cm Ao Asc diam:  3.20 cm MITRAL VALVE               TRICUSPID VALVE MV Area (PHT): 4.04 cm    TR Peak grad:   27.2 mmHg MV Decel Time: 188 msec    TR Vmax:        261.00 cm/s MV E velocity: 85.50 cm/s MV A velocity: 32.60 cm/s  SHUNTS MV E/A ratio:  2.62        Systemic Diam: 2.10 cm Annabella Scarce MD Electronically signed by Annabella Scarce MD Signature Date/Time: 10/08/2024/11:37:13 AM    Final    DG Chest 2 View Result Date: 10/07/2024 CLINICAL DATA:  Hyperkalemia EXAM: CHEST - 2 VIEW COMPARISON:  None Available. FINDINGS: The heart size and mediastinal contours are within normal limits. Both lungs are clear. The visualized skeletal structures are unremarkable. IMPRESSION: No active cardiopulmonary disease. Electronically Signed   By: Lynwood Landy Raddle M.D.   On: 10/07/2024 14:41    Microbiology: No results found for this or any  previous visit.  Labs: CBC: Recent Labs  Lab 10/07/24 1320 10/08/24 0459  WBC 4.1 5.0  HGB 10.9* 10.0*  HCT 32.2* 29.8*  MCV 93.6 92.8  PLT 305 290   Basic Metabolic Panel: Recent Labs  Lab 10/07/24 1320 10/08/24 0459 10/08/24 1001  NA 130* 132*  --   K 5.2* 4.7  --   CL 92* 97*  --   CO2 29 29  --   GLUCOSE 71 83  --   BUN 38* 34*  --   CREATININE 1.34* 1.36*  --   CALCIUM  9.9 9.5  --   MG  --   --  1.9   Liver Function Tests: No results for input(s): AST, ALT, ALKPHOS, BILITOT, PROT, ALBUMIN in the last 168 hours. CBG: No results for input(s): GLUCAP in the last 168 hours.  Discharge time spent: 35 minutes.  Signed: Murvin Mana, MD [facility] 10/08/2024 "

## 2024-10-18 ENCOUNTER — Ambulatory Visit: Admitting: Physician Assistant
